# Patient Record
Sex: Female | Born: 1970
Health system: Southern US, Community
[De-identification: ages and names within clinical notes are randomized; demographics above are authoritative.]

## PROBLEM LIST (undated history)

## (undated) DIAGNOSIS — T148XXA Other injury of unspecified body region, initial encounter: Secondary | ICD-10-CM

## (undated) DIAGNOSIS — G43909 Migraine, unspecified, not intractable, without status migrainosus: Secondary | ICD-10-CM

## (undated) DIAGNOSIS — I10 Essential (primary) hypertension: Secondary | ICD-10-CM

## (undated) DIAGNOSIS — E119 Type 2 diabetes mellitus without complications: Secondary | ICD-10-CM

## (undated) DIAGNOSIS — M5030 Other cervical disc degeneration, unspecified cervical region: Secondary | ICD-10-CM

## (undated) DIAGNOSIS — M779 Enthesopathy, unspecified: Secondary | ICD-10-CM

## (undated) HISTORY — PX: TUBAL LIGATION: SHX77

## (undated) HISTORY — DX: Type 2 diabetes mellitus without complications: E11.9

## (undated) HISTORY — PX: MYOMECTOMY: SHX85

## (undated) HISTORY — PX: BUNIONECTOMY: SHX129

---

## 1998-04-17 ENCOUNTER — Emergency Department (HOSPITAL_COMMUNITY): Admission: EM | Admit: 1998-04-17 | Discharge: 1998-04-17 | Payer: Self-pay | Admitting: Emergency Medicine

## 1998-04-17 ENCOUNTER — Encounter: Payer: Self-pay | Admitting: Emergency Medicine

## 1999-05-04 ENCOUNTER — Encounter: Admission: RE | Admit: 1999-05-04 | Discharge: 1999-05-04 | Payer: Self-pay | Admitting: Orthopedic Surgery

## 1999-05-04 ENCOUNTER — Encounter: Payer: Self-pay | Admitting: Orthopedic Surgery

## 1999-09-17 ENCOUNTER — Encounter: Payer: Self-pay | Admitting: Obstetrics and Gynecology

## 1999-09-17 ENCOUNTER — Ambulatory Visit (HOSPITAL_COMMUNITY): Admission: RE | Admit: 1999-09-17 | Discharge: 1999-09-17 | Payer: Self-pay | Admitting: Obstetrics and Gynecology

## 1999-10-15 ENCOUNTER — Other Ambulatory Visit: Admission: RE | Admit: 1999-10-15 | Discharge: 1999-10-15 | Payer: Self-pay | Admitting: Obstetrics and Gynecology

## 2001-01-10 ENCOUNTER — Other Ambulatory Visit: Admission: RE | Admit: 2001-01-10 | Discharge: 2001-01-10 | Payer: Self-pay | Admitting: Obstetrics and Gynecology

## 2001-01-17 ENCOUNTER — Ambulatory Visit (HOSPITAL_COMMUNITY): Admission: RE | Admit: 2001-01-17 | Discharge: 2001-01-17 | Payer: Self-pay | Admitting: Obstetrics and Gynecology

## 2001-01-17 ENCOUNTER — Encounter: Payer: Self-pay | Admitting: Obstetrics and Gynecology

## 2008-07-27 ENCOUNTER — Inpatient Hospital Stay (HOSPITAL_COMMUNITY): Admission: AD | Admit: 2008-07-27 | Discharge: 2008-07-27 | Payer: Self-pay | Admitting: Obstetrics and Gynecology

## 2008-07-27 ENCOUNTER — Encounter (INDEPENDENT_AMBULATORY_CARE_PROVIDER_SITE_OTHER): Payer: Self-pay | Admitting: Obstetrics and Gynecology

## 2009-01-05 ENCOUNTER — Encounter (INDEPENDENT_AMBULATORY_CARE_PROVIDER_SITE_OTHER): Payer: Self-pay | Admitting: Obstetrics and Gynecology

## 2009-01-05 ENCOUNTER — Ambulatory Visit (HOSPITAL_COMMUNITY): Admission: RE | Admit: 2009-01-05 | Discharge: 2009-01-05 | Payer: Self-pay | Admitting: Obstetrics and Gynecology

## 2009-03-21 ENCOUNTER — Inpatient Hospital Stay (HOSPITAL_COMMUNITY): Admission: AD | Admit: 2009-03-21 | Discharge: 2009-03-21 | Payer: Self-pay | Admitting: Obstetrics and Gynecology

## 2009-06-17 ENCOUNTER — Encounter (INDEPENDENT_AMBULATORY_CARE_PROVIDER_SITE_OTHER): Payer: Self-pay | Admitting: Obstetrics and Gynecology

## 2009-06-17 ENCOUNTER — Inpatient Hospital Stay (HOSPITAL_COMMUNITY): Admission: RE | Admit: 2009-06-17 | Discharge: 2009-06-19 | Payer: Self-pay | Admitting: Obstetrics and Gynecology

## 2010-02-04 ENCOUNTER — Encounter
Admission: RE | Admit: 2010-02-04 | Discharge: 2010-03-23 | Payer: Self-pay | Source: Home / Self Care | Attending: Obstetrics and Gynecology | Admitting: Obstetrics and Gynecology

## 2010-02-04 ENCOUNTER — Ambulatory Visit (HOSPITAL_COMMUNITY)
Admission: RE | Admit: 2010-02-04 | Discharge: 2010-02-04 | Payer: Self-pay | Source: Home / Self Care | Attending: Obstetrics and Gynecology | Admitting: Obstetrics and Gynecology

## 2010-02-10 ENCOUNTER — Encounter: Admission: RE | Admit: 2010-02-10 | Payer: Self-pay | Source: Home / Self Care | Admitting: Obstetrics and Gynecology

## 2010-02-21 HISTORY — PX: TUBAL LIGATION: SHX77

## 2010-03-15 ENCOUNTER — Encounter: Payer: Self-pay | Admitting: Obstetrics and Gynecology

## 2010-04-15 ENCOUNTER — Encounter (HOSPITAL_COMMUNITY): Payer: Self-pay | Admitting: Radiology

## 2010-04-15 ENCOUNTER — Inpatient Hospital Stay (HOSPITAL_COMMUNITY)
Admission: AD | Admit: 2010-04-15 | Discharge: 2010-04-16 | DRG: 781 | Disposition: A | Discharge status: Home / Self Care | Payer: Managed Care, Other (non HMO) | Source: Ambulatory Visit | Attending: Obstetrics and Gynecology | Admitting: Obstetrics and Gynecology

## 2010-04-15 ENCOUNTER — Inpatient Hospital Stay (HOSPITAL_COMMUNITY): Payer: Managed Care, Other (non HMO)

## 2010-04-15 DIAGNOSIS — E119 Type 2 diabetes mellitus without complications: Secondary | ICD-10-CM | POA: Diagnosis present

## 2010-04-15 DIAGNOSIS — O2390 Unspecified genitourinary tract infection in pregnancy, unspecified trimester: Secondary | ICD-10-CM | POA: Diagnosis present

## 2010-04-15 DIAGNOSIS — O26879 Cervical shortening, unspecified trimester: Principal | ICD-10-CM | POA: Diagnosis present

## 2010-04-15 DIAGNOSIS — N390 Urinary tract infection, site not specified: Secondary | ICD-10-CM | POA: Diagnosis present

## 2010-04-15 DIAGNOSIS — O24919 Unspecified diabetes mellitus in pregnancy, unspecified trimester: Secondary | ICD-10-CM | POA: Diagnosis present

## 2010-04-15 DIAGNOSIS — O3430 Maternal care for cervical incompetence, unspecified trimester: Secondary | ICD-10-CM

## 2010-04-15 LAB — GLUCOSE, CAPILLARY
Glucose-Capillary: 112 mg/dL — ABNORMAL HIGH (ref 70–99)
Glucose-Capillary: 104 mg/dL — ABNORMAL HIGH (ref 70–99)

## 2010-04-15 LAB — CBC
Hemoglobin: 10.7 g/dL — ABNORMAL LOW (ref 12.0–15.0)
MCH: 27.5 pg (ref 26.0–34.0)
MCHC: 32.1 g/dL (ref 30.0–36.0)
RBC: 3.89 MIL/uL (ref 3.87–5.11)
RDW: 14.5 % (ref 11.5–15.5)

## 2010-04-15 LAB — DIFFERENTIAL
Basophils Absolute: 0 10*3/uL (ref 0.0–0.1)
Basophils Relative: 0 % (ref 0–1)

## 2010-04-16 LAB — GLUCOSE, CAPILLARY
Glucose-Capillary: 108 mg/dL — ABNORMAL HIGH (ref 70–99)
Glucose-Capillary: 102 mg/dL — ABNORMAL HIGH (ref 70–99)

## 2010-04-17 LAB — STREP B DNA PROBE: Strep Group B Ag: NEGATIVE

## 2010-04-17 LAB — URINE CULTURE: Colony Count: >=100000

## 2010-05-09 LAB — CBC
HCT: 33.2 % — ABNORMAL LOW (ref 36.0–46.0)
Hemoglobin: 10.6 g/dL — ABNORMAL LOW (ref 12.0–15.0)

## 2010-05-09 LAB — HCG, SERUM, QUALITATIVE: Preg, Serum: NEGATIVE

## 2010-05-11 LAB — CBC
HCT: 32.8 % — ABNORMAL LOW (ref 36.0–46.0)
Hemoglobin: 10.3 g/dL — ABNORMAL LOW (ref 12.0–15.0)
Hemoglobin: 10.6 g/dL — ABNORMAL LOW (ref 12.0–15.0)
MCHC: 32.3 g/dL (ref 30.0–36.0)
MCV: 80 fL (ref 78.0–100.0)
Platelets: 236 10*3/uL (ref 150–400)
Platelets: 255 10*3/uL (ref 150–400)
RDW: 15.4 % (ref 11.5–15.5)
RDW: 15.6 % — ABNORMAL HIGH (ref 11.5–15.5)

## 2010-05-11 LAB — PREGNANCY, URINE: Preg Test, Ur: NEGATIVE

## 2010-05-26 LAB — CBC
HCT: 31 % — ABNORMAL LOW (ref 36.0–46.0)
Hemoglobin: 9.9 g/dL — ABNORMAL LOW (ref 12.0–15.0)
Platelets: 244 10*3/uL (ref 150–400)
RBC: 4.08 MIL/uL (ref 3.87–5.11)
WBC: 4.9 10*3/uL (ref 4.0–10.5)

## 2010-05-31 LAB — CBC
HCT: 36.5 % (ref 36.0–46.0)
Hemoglobin: 12.5 g/dL (ref 12.0–15.0)
MCV: 87.3 fL (ref 78.0–100.0)
Platelets: 197 10*3/uL (ref 150–400)
RBC: 4.18 MIL/uL (ref 3.87–5.11)
RDW: 13.8 % (ref 11.5–15.5)

## 2010-07-09 ENCOUNTER — Inpatient Hospital Stay (HOSPITAL_COMMUNITY): Payer: Managed Care, Other (non HMO)

## 2010-07-09 ENCOUNTER — Inpatient Hospital Stay (HOSPITAL_COMMUNITY)
Admission: AD | Admit: 2010-07-09 | Discharge: 2010-07-09 | Disposition: A | Discharge status: Home / Self Care | Payer: Managed Care, Other (non HMO) | Source: Ambulatory Visit | Attending: Obstetrics & Gynecology | Admitting: Obstetrics & Gynecology

## 2010-07-09 DIAGNOSIS — O368390 Maternal care for abnormalities of the fetal heart rate or rhythm, unspecified trimester, not applicable or unspecified: Secondary | ICD-10-CM | POA: Insufficient documentation

## 2010-07-09 DIAGNOSIS — O99810 Abnormal glucose complicating pregnancy: Secondary | ICD-10-CM | POA: Insufficient documentation

## 2010-07-26 ENCOUNTER — Other Ambulatory Visit: Payer: Self-pay | Admitting: Obstetrics and Gynecology

## 2010-07-26 ENCOUNTER — Encounter (HOSPITAL_COMMUNITY): Payer: Managed Care, Other (non HMO)

## 2010-07-26 LAB — BASIC METABOLIC PANEL
BUN: 7 mg/dL (ref 6–23)
Creatinine, Ser: 0.58 mg/dL (ref 0.4–1.2)
GFR calc non Af Amer: >60 mL/min (ref >60)
Glucose, Bld: 121 mg/dL — ABNORMAL HIGH (ref 70–99)
Potassium: 3.6 mEq/L (ref 3.5–5.1)

## 2010-07-26 LAB — CBC
HCT: 34.7 % — ABNORMAL LOW (ref 36.0–46.0)
MCH: 28.1 pg (ref 26.0–34.0)
MCHC: 32.9 g/dL (ref 30.0–36.0)
MCV: 85.7 fL (ref 78.0–100.0)
RDW: 14.3 % (ref 11.5–15.5)
WBC: 6.4 10*3/uL (ref 4.0–10.5)

## 2010-08-02 ENCOUNTER — Other Ambulatory Visit: Payer: Self-pay | Admitting: Obstetrics and Gynecology

## 2010-08-02 ENCOUNTER — Inpatient Hospital Stay (HOSPITAL_COMMUNITY)
Admission: RE | Admit: 2010-08-02 | Discharge: 2010-08-05 | DRG: 765 | Disposition: A | Discharge status: Home / Self Care | Payer: Managed Care, Other (non HMO) | Source: Ambulatory Visit | Attending: Obstetrics and Gynecology | Admitting: Obstetrics and Gynecology

## 2010-08-02 DIAGNOSIS — Z01818 Encounter for other preprocedural examination: Secondary | ICD-10-CM

## 2010-08-02 DIAGNOSIS — Z302 Encounter for sterilization: Secondary | ICD-10-CM

## 2010-08-02 DIAGNOSIS — O9903 Anemia complicating the puerperium: Secondary | ICD-10-CM | POA: Diagnosis not present

## 2010-08-02 DIAGNOSIS — O2432 Unspecified pre-existing diabetes mellitus in childbirth: Secondary | ICD-10-CM | POA: Diagnosis present

## 2010-08-02 DIAGNOSIS — Z01812 Encounter for preprocedural laboratory examination: Secondary | ICD-10-CM

## 2010-08-02 DIAGNOSIS — D62 Acute posthemorrhagic anemia: Secondary | ICD-10-CM | POA: Diagnosis not present

## 2010-08-02 DIAGNOSIS — O3490 Maternal care for abnormality of pelvic organ, unspecified, unspecified trimester: Principal | ICD-10-CM | POA: Diagnosis present

## 2010-08-02 DIAGNOSIS — E119 Type 2 diabetes mellitus without complications: Secondary | ICD-10-CM | POA: Diagnosis present

## 2010-08-02 DIAGNOSIS — O09529 Supervision of elderly multigravida, unspecified trimester: Secondary | ICD-10-CM | POA: Diagnosis present

## 2010-08-02 DIAGNOSIS — D4959 Neoplasm of unspecified behavior of other genitourinary organ: Secondary | ICD-10-CM | POA: Diagnosis present

## 2010-08-02 DIAGNOSIS — O34599 Maternal care for other abnormalities of gravid uterus, unspecified trimester: Secondary | ICD-10-CM | POA: Diagnosis present

## 2010-08-02 DIAGNOSIS — D252 Subserosal leiomyoma of uterus: Secondary | ICD-10-CM | POA: Diagnosis present

## 2010-08-02 LAB — GLUCOSE, CAPILLARY
Glucose-Capillary: 99 mg/dL (ref 70–99)
Glucose-Capillary: 101 mg/dL — ABNORMAL HIGH (ref 70–99)
Glucose-Capillary: 121 mg/dL — ABNORMAL HIGH (ref 70–99)

## 2010-08-03 LAB — CBC
MCH: 27.4 pg (ref 26.0–34.0)
MCHC: 32.1 g/dL (ref 30.0–36.0)
Platelets: 135 10*3/uL — ABNORMAL LOW (ref 150–400)
RBC: 3.5 MIL/uL — ABNORMAL LOW (ref 3.87–5.11)

## 2010-08-03 LAB — GLUCOSE, CAPILLARY
Glucose-Capillary: 172 mg/dL — ABNORMAL HIGH (ref 70–99)
Glucose-Capillary: 102 mg/dL — ABNORMAL HIGH (ref 70–99)

## 2010-08-04 LAB — GLUCOSE, CAPILLARY
Glucose-Capillary: 116 mg/dL — ABNORMAL HIGH (ref 70–99)
Glucose-Capillary: 99 mg/dL (ref 70–99)
Glucose-Capillary: 128 mg/dL — ABNORMAL HIGH (ref 70–99)
Glucose-Capillary: 106 mg/dL — ABNORMAL HIGH (ref 70–99)

## 2010-08-05 LAB — GLUCOSE, CAPILLARY: Glucose-Capillary: 102 mg/dL — ABNORMAL HIGH (ref 70–99)

## 2010-08-10 NOTE — Op Note (Signed)
NAMEJAKIYAH, STEPNEY NO.:  [id number]  MEDICAL RECORD NO.:  [id number]  LOCATION:  9148                          FACILITY:  [facility]  PHYSICIAN:  Maxie Better, M.D.DATE OF BIRTH:  1970-05-04  DATE OF PROCEDURE:  08/02/2010 DATE OF DISCHARGE:                              OPERATIVE REPORT   PREOPERATIVE DIAGNOSES:  Previous myomectomy, term gestation class B diabetic, desires sterilization.  POSTOPERATIVE DIAGNOSES:  Previous myomectomy, desires sterilization, term gestation class B diabetes.  PROCEDURES:  Primary cesarean section, Sharl Ma hysterotomy, modified Pomeroy tubal ligation.  ANESTHESIA:  Spinal.  SURGEON:  Maxie Better, MD  ASSISTANT:  Marlinda Mike, CNM  PROCEDURE IN DETAILS:  Under adequate spinal anesthesia, the patient was placed in a supine position with a left lateral tilt.  She was sterilely prepped and draped in usual fashion.  Indwelling Foley catheter was sterilely placed.  Marcaine 0.25% was injected along the previous Pfannenstiel skin incision site.  Pfannenstiel skin incision was then made, carried down to the rectus fascia.  The rectus fascia was then opened transversely, the rectus fascia was then sharply and bluntly dissected off the rectus muscle in a superior and inferior fashion.  The rectus muscle was split in the midline.  The parietal peritoneum was entered bluntly.  The lower uterine segment was undeveloped. Vesicouterine peritoneum was opened transversely.  There was large prominent blood vessels noted on both sides of the incision and low transverse uterine incision was then made and extended with bandage scissors.  Artificial rupture of membranes was performed after securing the floating vertex.  Subsequent delivery of a live female from the right occiput transverse position was accomplished.  Cord around the neck x2 was reduced.  The baby was bulb suctioned.  The abdomen and cord was clamped, cut, and the baby  was transferred to the awaiting pediatrician who assigned Apgars of 9 and 9 at 1 and 5 minutes.  The placenta was manually removed with a partially focal finding of the placenta attached in the left upper quadrant.  The placenta was removed, the pieces in that left upper quadrant was removed.  The cavity was explored.  No other findings were noted.  The incision on the right had pumping vessels in 2 locations.  The uterine incision was then closed in 2 layers.  The first layer was 0 Monocryl running locked stitch.  Second layer was imbricated using 0 Monocryl suture.  Figure-of-eight sutures placed along the incision line for hemostasis.  Small bleeding was cauterized in the inferior aspect of the incision.  Attention was then turned to the fallopian tubes, both of which were normal.  The ovaries were normal. There was omental adhesion to the left fundal region which was lysed. The midportion of both fallopian tube was grasped with a Babcock.  The underlying mesosalpinx was opened with cautery.  The proximal distal portion of the tube was then tied with 0 chromic suture proximally and distally x2 on both sides and intervening segment of fallopian tubes was removed on both sides.  Small subserosal fibroids were noted which were not needed to be removed.  The abdomen was then copiously irrigated and suctioned.  The uterine incision was inspected.  Small bleeders again cauterized with good hemostasis subsequently noted.  The parietal peritoneum was closed with 0 Vicryl, the rectus fascia was closed with 0 Vicryl x2.  The subcutaneous area was irrigated, small bleeders cauterized.  Interrupted 2-0 plain sutures placed.  The previous scar was partially removed and the skin approximated using Ethicon staples.  SPECIMENS:  Portion of right and left fallopian tubes sent to Pathology. The placenta was not sent.  Estimated blood loss was 1 L.  Urine output was 250 mL clear  yellow urine.  INTRAOPERATIVE FLUIDS:  4300.  Sponge and instrument counts x2 was correct.  COMPLICATIONS:  None.  Weight of the baby was 8 pounds 5 ounces.  The patient tolerated the procedure well, was transferred to recovery in stable condition.     Maxie Better, M.D.     Advance/MEDQ  D:  08/02/2010  T:  08/03/2010  Job:  [id number]  Electronically Signed by Nena Jordan Lea Baine M.D. on 08/10/2010 05:53:27 AM

## 2010-09-11 NOTE — Discharge Summary (Signed)
Becky Simmons, Becky Simmons             ACCOUNT NO.:  [id number]  MEDICAL RECORD NO.:  [id number]  LOCATION:  9148                          [facility]ACILITY:  [facility]  PHYSICIAN:  Maxie Better, M.D.DATE OF BIRTH:  1970/08/25  DATE OF ADMISSION:  08/02/2010 DATE OF DISCHARGE:  08/05/2010                              DISCHARGE SUMMARY   ADMITTING DIAGNOSES: 1. A 39 weeks' gestation. 2. Previous myomectomy. 3. Class B diabetic, insulin dependent. 4. Scheduled primary cesarean section.  DISCHARGE DIAGNOSES: 1. Postoperative day #3 status post cesarean section and bilateral     tubal sterilization. 2. Class B diabetic.  PATIENT HISTORY:  The patient is a 40 year old gravida 5, 3-0-1-3 at 32 weeks' gestation with an [facility] of August 07, 2010.  The patient has received prenatal care at [facility] since 8 weeks' gestation with a primary as Dr. Cherly Hensen.  PRENATAL LABS:  Blood type and Rh AB positive, antibody screen negative, rubella positive, HIV negative, RPR negative, hepatitis B negative, and a GBS negative.  Early GTT screening in first trimester revealed a blood sugar of 230 with diagnosis of preexisting diabetes.  Prenatal course was complicated by preexisting diabetes and diagnosed prior to pregnancy, obesity, advanced maternal age, previous myomectomy.  MEDICAL SURGICAL HISTORY:  History of migraines and myomectomy in 2011.  ALLERGIES:  No known drug allergies.  CURRENT MEDICATIONS AT TIME OF ADMISSION: 1. Prenatal vitamin daily. 2. Lantus 38 units at bedtime. 3. Humalog 8 units subcu with meals.  PRESENTATION:  The patient presents for scheduled cesarean section. Preoperative CBC; white blood cell count of 6.4, hemoglobin of 11.4, hematocrit 34.7, and a platelet count of 149.  PROCEDURE:  The patient undergoes cesarean section and bilateral tubal sterilization by Dr. Cherly Hensen on the day of admission, with delivery of a female, 8 pounds 5 ounces, with Apgar scores of 9 and 9.   Newborn is transferred to the regular nursery.  POSTOPERATIVE CARE:  The patient's postoperative course was uncomplicated with achievement of glycemic control postpartum postop prior to discharge.  Postoperative CBC; white blood cell count 9.5, hemoglobin 9.6, hematocrit 29.9, and a platelet count of 135.  Vital signs were stable.  The patient remained afebrile during hospitalization.  The patient was initiated on half of her insulin dose for antepartum dosing.  Fasting blood sugars 166, 135, 126; and postprandial range of 97-128 on 20 units of Lantus at bedtime and NovoLog 4 units with meals.  Physical exam is within normal limits. Wound edges are well approximated with staples.  No erythema, no ecchymosis, or drainage.  The patient is breast and bottle feeding. Newborn undergoes circumcision prior to discharge.  DISCHARGE CARE:  The patient was discharged home in stable condition on postoperative day #3.  Staples were intact for removal on day 7 at [facility]. The patient is on carb-modified diet.  Activity restrictions x2 weeks.  POSTPARTUM INSTRUCTIONS:  Per [facility] booklet.  MEDICATIONS AT THE TIME OF DISCHARGE: 1. Prenatal vitamin 1 tablet daily. 2. Nu-Iron 150 mg p.o. daily x8 weeks. 3. Ibuprofen 800 mg every 8 hours as needed for discomfort. 4. Percocet 1-2 tablets every 4 hours as needed for pain. 5. Lantus 20 units at bedtime. 6. Metformin 500  mg p.o. in a.m.  The patient to continue monitoring blood sugars with a fasting, 2-hour postprandial lunch, and 2-hour postprandial dinner for monitoring t.i.d. The patient to return to [facility] on Monday, August 09, 2010, for staple removal and review of blood sugar control with the goal for discontinuation of Lantus and saturation of metformin dose for oral glycemic control.     Becky Simmons, C.N.M.   ______________________________ Maxie Better, M.D.    TB/MEDQ  D:  08/06/2010  T:  08/06/2010  Job:  [id number]  Electronically  Signed by Becky Simmons C.N.M. on 09/08/2010 07:01:40 PM Electronically Signed by Nena Jordan Danea Manter M.D. on 09/11/2010 06:34:40 AM

## 2010-12-07 ENCOUNTER — Encounter (HOSPITAL_COMMUNITY): Payer: Self-pay | Admitting: *Deleted

## 2011-11-18 ENCOUNTER — Other Ambulatory Visit (HOSPITAL_BASED_OUTPATIENT_CLINIC_OR_DEPARTMENT_OTHER): Payer: Self-pay | Admitting: Family Medicine

## 2011-11-18 ENCOUNTER — Ambulatory Visit (HOSPITAL_BASED_OUTPATIENT_CLINIC_OR_DEPARTMENT_OTHER)
Admission: RE | Admit: 2011-11-18 | Discharge: 2011-11-18 | Disposition: A | Discharge status: Home / Self Care | Payer: BC Managed Care – PPO | Source: Ambulatory Visit | Attending: Family Medicine | Admitting: Family Medicine

## 2011-11-18 DIAGNOSIS — R1031 Right lower quadrant pain: Secondary | ICD-10-CM

## 2012-01-20 ENCOUNTER — Encounter (HOSPITAL_BASED_OUTPATIENT_CLINIC_OR_DEPARTMENT_OTHER): Payer: Self-pay | Admitting: *Deleted

## 2012-01-20 ENCOUNTER — Emergency Department (HOSPITAL_BASED_OUTPATIENT_CLINIC_OR_DEPARTMENT_OTHER)
Admission: EM | Admit: 2012-01-20 | Discharge: 2012-01-20 | Disposition: A | Discharge status: Home / Self Care | Payer: BC Managed Care – PPO | Attending: Emergency Medicine | Admitting: Emergency Medicine

## 2012-01-20 DIAGNOSIS — R1084 Generalized abdominal pain: Secondary | ICD-10-CM | POA: Insufficient documentation

## 2012-01-20 DIAGNOSIS — Z3202 Encounter for pregnancy test, result negative: Secondary | ICD-10-CM | POA: Insufficient documentation

## 2012-01-20 DIAGNOSIS — R109 Unspecified abdominal pain: Secondary | ICD-10-CM

## 2012-01-20 LAB — CBC
Hemoglobin: 11 g/dL — ABNORMAL LOW (ref 12.0–15.0)
Platelets: 215 10*3/uL (ref 150–400)
RBC: 4.15 MIL/uL (ref 3.87–5.11)
WBC: 5.3 10*3/uL (ref 4.0–10.5)

## 2012-01-20 LAB — COMPREHENSIVE METABOLIC PANEL
ALT: 9 U/L (ref 0–35)
AST: 11 U/L (ref 0–37)
Alkaline Phosphatase: 106 U/L (ref 39–117)
CO2: 24 mEq/L (ref 19–32)
Calcium: 8.8 mg/dL (ref 8.4–10.5)
Chloride: 100 mEq/L (ref 96–112)
GFR calc Af Amer: >90 mL/min (ref >90)
GFR calc non Af Amer: >90 mL/min (ref >90)
Glucose, Bld: 213 mg/dL — ABNORMAL HIGH (ref 70–99)
Potassium: 3.7 mEq/L (ref 3.5–5.1)
Sodium: 136 mEq/L (ref 135–145)

## 2012-01-20 LAB — URINALYSIS, ROUTINE W REFLEX MICROSCOPIC
Glucose, UA: NEGATIVE mg/dL
Ketones, ur: NEGATIVE mg/dL
Nitrite: NEGATIVE
Specific Gravity, Urine: 1.023 (ref 1.005–1.030)
pH: 6 (ref 5.0–8.0)

## 2012-01-20 LAB — PREGNANCY, URINE: Preg Test, Ur: NEGATIVE

## 2012-01-20 LAB — URINE MICROSCOPIC-ADD ON

## 2012-01-20 NOTE — ED Notes (Signed)
Pt describes soreness and pressure in her abdomen.  This sensation began after she felt a "pop" in her abdomen (unable to say where).  Pt states that she feels that her abdomen is protruding more since this began.  Pt states that she had increasing soreness (it was mild pain) yesterday and she was unable to eat as much as normal.  No n/v diarrhea or chills with this.  Pt is in no acute distress at this time.  No GU symptoms, no mass or hernia reported or felt on pt abdomen

## 2012-01-20 NOTE — ED Provider Notes (Signed)
History     CSN: [id number]  Arrival date & time 01/20/12  3244   First MD Initiated Contact with Patient 01/20/12 0945      Chief Complaint  Patient presents with  . Abdominal Pain    (Consider location/radiation/quality/duration/timing/severity/associated sxs/prior treatment) HPI Pt presenting with c/o sensation of pressure and soreness in her abdomen diffusely.  She states that 2 weeks ago she felt a pop in her mid abdomen and feels that her abdomen is more distended than usual.  She states she feels muscle soreness of abdominal wall- similar to doing situps. No vomiting, no fever/chills, no dsyuria.  No change in stools.  States she gets full with eating approx 1/2 of what she normally would eat over the past 2 weeks.  Cannot say what makes pain better or worse.  Denies pain currently.  There are no other associated systemic symptoms, there are no other alleviating or modifying factors.   History reviewed. No pertinent past medical history.  Past Surgical History  Procedure Date  . Cesarean section 2012  . Myomectomy   . Tubal ligation     No family history on file.  History  Substance Use Topics  . Smoking status: Not on file  . Smokeless tobacco: Not on file  . Alcohol Use: No    OB History    Grav Para Term Preterm Abortions TAB SAB Ect Mult Living   1               Review of Systems ROS reviewed and all otherwise negative except for mentioned in HPI  Allergies  Review of patient's allergies indicates no known allergies.  Home Medications  No current outpatient prescriptions on file.  BP 135/90  Pulse 79  Temp 98.8 F (37.1 C) (Oral)  Ht 5\' 10"  (1.778 m)  Wt 230 lb (104.327 kg)  BMI 33.00 kg/m2  SpO2 99%  LMP 12/23/2011 Vitals reviewed Physical Exam Physical Examination: General appearance - alert, well appearing, and in no distress Mental status - alert, oriented to person, place, and time Eyes - no conjunctival injection, no scleral  icterus Mouth - mucous membranes moist, pharynx normal without lesions Chest - clear to auscultation, no wheezes, rales or rhonchi, symmetric air entry Heart - normal rate, regular rhythm, normal S1, S2, no murmurs, rubs, clicks or gallops Abdomen - soft, nontender, nondistended, no masses or organomegaly, nabs Extremities - peripheral pulses normal, no pedal edema, no clubbing or cyanosis Skin - normal coloration and turgor, no rashes  ED Course  Procedures (including critical care time)  Labs Reviewed  URINALYSIS, ROUTINE W REFLEX MICROSCOPIC - Abnormal; Notable for the following:    Leukocytes, UA SMALL (*)     All other components within normal limits  CBC - Abnormal; Notable for the following:    Hemoglobin 11.0 (*)     HCT 33.0 (*)     All other components within normal limits  COMPREHENSIVE METABOLIC PANEL - Abnormal; Notable for the following:    Glucose, Bld 213 (*)     All other components within normal limits  URINE MICROSCOPIC-ADD ON - Abnormal; Notable for the following:    Bacteria, UA MANY (*)     Casts HYALINE CASTS (*)     All other components within normal limits  PREGNANCY, URINE  LIPASE, BLOOD  URINE CULTURE   No results found.   1. Abdominal pain       MDM  Pt presenting with diffuse abdominal soreness and decreased appetite.  She has no tenderness of her abdomen on exam, overall benign exam.  Labs reassuring as well.  Doubt acute emergent condition such as SBO, cholecystitis or other intra-abdominal pathology.  Pt advised to f/u with her PMD for further testing if symptoms continue.  Discharged with strict return precautions.  Pt agreeable with plan.        Ethelda Chick, MD 01/20/12 1153

## 2012-01-21 LAB — URINE CULTURE

## 2012-01-22 NOTE — ED Notes (Signed)
+   Urine Chart sent to EDP office for review. 

## 2012-01-24 NOTE — ED Notes (Signed)
Chart returned from EDP. Likely contaminant. No abx tx necessary at this time Per Trixie Dredge.

## 2012-03-28 ENCOUNTER — Other Ambulatory Visit: Payer: Self-pay | Admitting: Family Medicine

## 2012-03-28 DIAGNOSIS — Z1231 Encounter for screening mammogram for malignant neoplasm of breast: Secondary | ICD-10-CM

## 2012-04-20 ENCOUNTER — Ambulatory Visit
Admission: RE | Admit: 2012-04-20 | Discharge: 2012-04-20 | Disposition: A | Discharge status: Home / Self Care | Payer: BC Managed Care – PPO | Source: Ambulatory Visit | Attending: Family Medicine | Admitting: Family Medicine

## 2012-04-20 DIAGNOSIS — Z1231 Encounter for screening mammogram for malignant neoplasm of breast: Secondary | ICD-10-CM

## 2012-06-15 ENCOUNTER — Telehealth: Payer: Self-pay | Admitting: *Deleted

## 2012-06-15 MED ORDER — SITAGLIP PHOS-METFORMIN HCL ER 50-1000 MG PO TB24: 1.0000 | ORAL_TABLET | Freq: Two times a day (BID) | ORAL | Status: DC

## 2012-06-15 NOTE — Telephone Encounter (Signed)
PT SAID DR. ZANARD PRESCRIBED JANUMET AND SHE SAID ITS NOT WORKING SHE NEEDS THE JANUMET XR. PT SAID DR. Herma Carson WANTED HER TO TAKE THE ONE WITH THE XR BUT WROTE IF FOR JUST JANUMET. PHARMACY USED IS [facility] ON [address].

## 2012-07-02 ENCOUNTER — Telehealth: Payer: Self-pay | Admitting: *Deleted

## 2012-07-02 NOTE — Telephone Encounter (Signed)
PT CALLED AND LM ON VOICE MAIL - SAYING THAT SHE IS TAKING JANUMET AND IS MAKING NAUSEATED EVERYDAY- WANTED TO KNOW IF DR. ZANARD COULD GIVE HER RX FOR JANUMET XR BECAUSE IT DOES NOT MAKE HER SICK. SHE SAID THAT SHE HAD CALLED BEFORE IN REF. TO THIS. PLEASE ADVISE

## 2012-07-03 NOTE — Telephone Encounter (Signed)
I did this on 4/25.  It was sent to [facility] on [address]. Please let her know she needs to contact her pharmacy. Thanks PG

## 2012-07-13 ENCOUNTER — Other Ambulatory Visit: Payer: Self-pay | Admitting: Family Medicine

## 2012-07-13 LAB — COMPLETE METABOLIC PANEL WITH GFR
ALT: 14 U/L (ref 0–35)
AST: 24 U/L (ref 0–37)
Albumin: 3.6 g/dL (ref 3.5–5.2)
Alkaline Phosphatase: 55 U/L (ref 39–117)
BUN: 8 mg/dL (ref 6–23)
CO2: 25 mEq/L (ref 19–32)
Calcium: 8.6 mg/dL (ref 8.4–10.5)
Chloride: 105 mEq/L (ref 96–112)
Creat: 0.76 mg/dL (ref 0.50–1.10)
GFR, Est African American: >89 mL/min
GFR, Est Non African American: >89 mL/min
Glucose, Bld: 92 mg/dL (ref 70–99)
Potassium: 4.1 mEq/L (ref 3.5–5.3)
Sodium: 138 mEq/L (ref 135–145)
Total Bilirubin: 0.7 mg/dL (ref 0.3–1.2)
Total Protein: 6.1 g/dL (ref 6.0–8.3)

## 2012-07-13 LAB — HEMOGLOBIN A1C
Hgb A1c MFr Bld: 6.2 % — ABNORMAL HIGH (ref <5.7)
Mean Plasma Glucose: 131 mg/dL — ABNORMAL HIGH (ref <117)

## 2012-07-14 LAB — MICROALBUMIN, URINE: Microalb, Ur: 0.68 mg/dL (ref 0.00–1.89)

## 2012-07-20 ENCOUNTER — Encounter: Payer: Self-pay | Admitting: Family Medicine

## 2012-07-20 ENCOUNTER — Encounter: Payer: Self-pay | Admitting: *Deleted

## 2012-07-20 ENCOUNTER — Ambulatory Visit (INDEPENDENT_AMBULATORY_CARE_PROVIDER_SITE_OTHER): Payer: BC Managed Care – PPO | Admitting: Family Medicine

## 2012-07-20 VITALS — BP 129/82 | HR 77 | Ht 70.0 in | Wt 216.0 lb

## 2012-07-20 DIAGNOSIS — IMO0001 Reserved for inherently not codable concepts without codable children: Secondary | ICD-10-CM

## 2012-07-20 DIAGNOSIS — Z23 Encounter for immunization: Secondary | ICD-10-CM

## 2012-07-20 MED ORDER — CANAGLIFLOZIN 300 MG PO TABS: 1.0000 | ORAL_TABLET | Freq: Every day | ORAL | Status: DC

## 2012-07-20 MED ORDER — SITAGLIP PHOS-METFORMIN HCL ER 50-1000 MG PO TB24: 1.0000 | ORAL_TABLET | Freq: Two times a day (BID) | ORAL | Status: DC

## 2012-07-20 NOTE — Patient Instructions (Addendum)
1)  Type II DM:  Continue on the Janumet XR and we'll change the [place] to Invokana 300 mg.  If you start to get lows then decrease your Janumet to 1 x per day.    Diabetes and Standards of Medical Care  Diabetes is complicated. You may find that your diabetes team includes a dietitian, nurse, diabetes educator, eye doctor, and more. To help everyone know what is going on and to help you get the care you deserve, the following schedule of care was developed to help keep you on track. Below are the tests, exams, vaccines, medicines, education, and plans you will need. A1c test  Performed at least 2 times a year if you are meeting treatment goals.  Performed 4 times a year if therapy has changed or if you are not meeting treatment goals. Blood pressure test  Performed at every routine medical visit. The goal is less than 120/80 mmHg. Dental exam  Follow up with the dentist regularly. Eye exam  Diagnosed with type 1 diabetes as a child: Get an exam upon reaching the age of 10 years or older and having had diabetes for 3 5 years. Yearly eye exams are recommended after that initial eye exam.  Diagnosed with type 1 diabetes as an adult: Get an exam within 5 years of diagnosis and then yearly.  Diagnosed with type 2 diabetes: Get an exam as soon as possible after the diagnosis and then yearly. Foot care exam  Visual foot exams are performed at every routine medical visit. The exams check for cuts, injuries, or other problems with the feet.  A comprehensive foot exam should be done yearly. This includes visual inspection as well as assessing foot pulses and testing for loss of sensation. Kidney function test (urine microalbumin)  Performed once a year.  Type 1 diabetes: The first test is performed 5 years after diagnosis.  Type 2 diabetes: The first test is performed at the time of diagnosis.  A serum creatinine and estimated glomerular filtration rate (eGFR) test is done once a year to  tell the level of chronic kidney disease (CKD), if present. Lipid profile (Cholesterol, HDL, LDL, Triglycerides)  Performed every 5 years for most people.  The goal for LDL is less than 100 mg/dl. If at high risk, the goal is less than 70 mg/dl.  The goal for HDL is 40 mg/dl 50 mg/dl for men and 50 mg/dl 60 mg/dl for women. An HDL cholesterol of 60 mg/dL or higher gives some protection against heart disease.  The goal for triglycerides is less than 150 mg/dl. Influenza vaccine, pneumococcal vaccine, and hepatitis B vaccine  The influenza vaccine is recommended yearly.  The pneumococcal vaccine is generally given once in a lifetime. However, there are some instances when another vaccination is recommended. Check with your caregiver.  The hepatitis B vaccine is also recommended for adults with diabetes. Diabetes self-management education  Recommended at diagnosis and ongoing as needed. Treatment plan  Reviewed at every medical visit. Document Released: 12/05/2008 Document Revised: 01/25/2012 Document Reviewed: 08/10/2010 [facility] Patient Information 2014 [place], [place].

## 2012-07-20 NOTE — Progress Notes (Signed)
Subjective:     Patient ID: Becky Simmons, female   DOB: 03-05-1970, 42 y.o.   MRN: [id number]  HPI  Becky Simmons is here today today to discuss her lab results and to get her Type II DM medications refilled.   Review of Systems  Constitutional: Negative for activity change, fatigue and unexpected weight change.  HENT: Negative.   Eyes: Negative.   Respiratory: Negative for shortness of breath.   Cardiovascular: Negative for chest pain, palpitations and leg swelling.  Gastrointestinal: Negative for diarrhea and constipation.  Endocrine: Negative.   Genitourinary: Negative for difficulty urinating.  Musculoskeletal: Negative.   Skin: Negative.   Neurological: Negative.   Hematological: Negative for adenopathy. Does not bruise/bleed easily.  Psychiatric/Behavioral: Negative for sleep disturbance and dysphoric mood. The patient is not nervous/anxious.      Past Medical History  Diagnosis Date  . Gestational diabetes    Family History  Problem Relation Age of Onset  . Diabetes Mother   . Diabetes Maternal Grandmother   . Diabetes Paternal Grandmother    History   Social History Narrative   Marital Status:  Married Clifton Custard)    Children:  5   Pets: Dog (1)   Living Situation: Lives with spouse and children.    Occupation: Futures trader    Education: Careers adviser (Cosmetology);    Tobacco Use/Exposure:  None    Alcohol Use:  Occasional   Drug Use:  None   Diet:  Regular   Exercise:  None   Hobbies: Movies                Objective:   Physical Exam  Constitutional: She appears well-nourished. No distress.  HENT:  Head: Normocephalic.  Eyes: No scleral icterus.  Neck: No thyromegaly present.  Cardiovascular: Normal rate, regular rhythm and normal heart sounds.   Pulmonary/Chest: Effort normal and breath sounds normal.  Abdominal: There is no tenderness.  Musculoskeletal: She exhibits no edema and no tenderness.  Neurological: She is alert.  Skin: Skin is warm  and dry.  Psychiatric: She has a normal mood and affect. Her behavior is normal. Judgment and thought content normal.       Assessment:        Plan:

## 2012-07-27 DIAGNOSIS — IMO0001 Reserved for inherently not codable concepts without codable children: Secondary | ICD-10-CM | POA: Insufficient documentation

## 2012-07-27 DIAGNOSIS — Z23 Encounter for immunization: Secondary | ICD-10-CM | POA: Insufficient documentation

## 2012-07-27 NOTE — Assessment & Plan Note (Signed)
She was given a Pneumovax without difficulty.

## 2012-07-27 NOTE — Assessment & Plan Note (Signed)
Her A1c is much improved on the [place]. She is going to try a combination of Invokana and Janumet XR.

## 2012-09-03 ENCOUNTER — Ambulatory Visit: Payer: BC Managed Care – PPO

## 2012-09-04 ENCOUNTER — Ambulatory Visit (INDEPENDENT_AMBULATORY_CARE_PROVIDER_SITE_OTHER): Payer: BC Managed Care – PPO | Admitting: *Deleted

## 2012-09-04 DIAGNOSIS — Z111 Encounter for screening for respiratory tuberculosis: Secondary | ICD-10-CM

## 2012-09-04 MED ORDER — TUBERCULIN PPD 5 UNIT/0.1ML ID SOLN
5.0000 [IU] | Freq: Once | INTRADERMAL | Status: AC
  Administered 2012-09-04: 5 [IU] via INTRADERMAL

## 2012-09-05 ENCOUNTER — Ambulatory Visit: Payer: BC Managed Care – PPO

## 2012-09-07 ENCOUNTER — Ambulatory Visit (INDEPENDENT_AMBULATORY_CARE_PROVIDER_SITE_OTHER): Payer: BC Managed Care – PPO | Admitting: *Deleted

## 2012-09-07 DIAGNOSIS — Z111 Encounter for screening for respiratory tuberculosis: Secondary | ICD-10-CM

## 2012-09-07 LAB — READ PPD: TB Skin Test: NEGATIVE

## 2012-09-07 MED ORDER — TUBERCULIN PPD 5 UNIT/0.1ML ID SOLN
5.0000 [IU] | Freq: Once | INTRADERMAL | Status: AC
  Administered 2012-09-07: 5 [IU] via INTRADERMAL

## 2012-09-10 ENCOUNTER — Ambulatory Visit: Payer: BC Managed Care – PPO | Admitting: *Deleted

## 2012-09-10 ENCOUNTER — Encounter: Payer: Self-pay | Admitting: *Deleted

## 2012-09-10 DIAGNOSIS — Z111 Encounter for screening for respiratory tuberculosis: Secondary | ICD-10-CM

## 2012-09-10 LAB — READ PPD: TB Skin Test: NEGATIVE

## 2012-09-27 ENCOUNTER — Other Ambulatory Visit: Payer: Self-pay | Admitting: *Deleted

## 2012-09-27 MED ORDER — CANAGLIFLOZIN 300 MG PO TABS: 1.0000 | ORAL_TABLET | Freq: Every day | ORAL | Status: DC

## 2013-03-05 ENCOUNTER — Ambulatory Visit (INDEPENDENT_AMBULATORY_CARE_PROVIDER_SITE_OTHER): Payer: BC Managed Care – PPO | Admitting: Family Medicine

## 2013-03-05 ENCOUNTER — Encounter: Payer: Self-pay | Admitting: Family Medicine

## 2013-03-05 VITALS — BP 137/84 | HR 96 | Temp 99.9°F | Resp 16 | Wt 214.0 lb

## 2013-03-05 DIAGNOSIS — R509 Fever, unspecified: Secondary | ICD-10-CM

## 2013-03-05 DIAGNOSIS — R059 Cough, unspecified: Secondary | ICD-10-CM

## 2013-03-05 DIAGNOSIS — R05 Cough: Secondary | ICD-10-CM

## 2013-03-05 LAB — POCT INFLUENZA A/B
Influenza A, POC: NEGATIVE
Influenza B, POC: NEGATIVE

## 2013-03-05 MED ORDER — BENZONATATE 200 MG PO CAPS: 200.0000 mg | ORAL_CAPSULE | Freq: Three times a day (TID) | ORAL | Status: DC | PRN

## 2013-03-05 MED ORDER — HYDROCOD POLST-CHLORPHEN POLST 10-8 MG/5ML PO LQCR: 5.0000 mL | Freq: Two times a day (BID) | ORAL | Status: DC | PRN

## 2013-03-05 NOTE — Patient Instructions (Signed)
1)  Head Congestion - Zyrtec D; Neil Med Sinus Rinse (Distilled Water + Blue Packet)  2)  Chest/Cough - Mucinex DM 1200/60 1 tab 2 x per day; Tessalon Perles 3 x per day; Tussionex 1 tsp 2 x per day; Umcka Cold Care; Vick's Vapor Rub; Ricola Cough Drops (Honey Lemon with Echinacea)   3)  Fever/Aches - Ibuprofen 800 mg plus Tylenol 1000 mg 3 times per day.      Bronchitis Bronchitis is inflammation of the airways that extend from the windpipe into the lungs (bronchi). The inflammation often causes mucus to develop, which leads to a cough. If the inflammation becomes severe, it may cause shortness of breath. CAUSES  Bronchitis may be caused by:   Viral infections.   Bacteria.   Cigarette smoke.   Allergens, pollutants, and other irritants.  SIGNS AND SYMPTOMS  The most common symptom of bronchitis is a frequent cough that produces mucus. Other symptoms include:  Fever.   Body aches.   Chest congestion.   Chills.   Shortness of breath.   Sore throat.  DIAGNOSIS  Bronchitis is usually diagnosed through a medical history and physical exam. Tests, such as chest X-rays, are sometimes done to rule out other conditions.  TREATMENT  You may need to avoid contact with whatever caused the problem (smoking, for example). Medicines are sometimes needed. These may include:  Antibiotics. These may be prescribed if the condition is caused by bacteria.  Cough suppressants. These may be prescribed for relief of cough symptoms.   Inhaled medicines. These may be prescribed to help open your airways and make it easier for you to breathe.   Steroid medicines. These may be prescribed for those with recurrent (chronic) bronchitis. HOME CARE INSTRUCTIONS  Get plenty of rest.   Drink enough fluids to keep your urine clear or pale yellow (unless you have a medical condition that requires fluid restriction). Increasing fluids may help thin your secretions and will prevent  dehydration.   Only take over-the-counter or prescription medicines as directed by your health care provider.  Only take antibiotics as directed. Make sure you finish them even if you start to feel better.  Avoid secondhand smoke, irritating chemicals, and strong fumes. These will make bronchitis worse. If you are a smoker, quit smoking. Consider using nicotine gum or skin patches to help control withdrawal symptoms. Quitting smoking will help your lungs heal faster.   Put a cool-mist humidifier in your bedroom at night to moisten the air. This may help loosen mucus. Change the water in the humidifier daily. You can also run the hot water in your shower and sit in the bathroom with the door closed for 5 10 minutes.   Follow up with your health care provider as directed.   Wash your hands frequently to avoid catching bronchitis again or spreading an infection to others.  SEEK MEDICAL CARE IF: Your symptoms do not improve after 1 week of treatment.  SEEK IMMEDIATE MEDICAL CARE IF:  Your fever increases.  You have chills.   You have chest pain.   You have worsening shortness of breath.   You have bloody sputum.  You faint.  You have lightheadedness.  You have a severe headache.   You vomit repeatedly. MAKE SURE YOU:   Understand these instructions.  Will watch your condition.  Will get help right away if you are not doing well or get worse. Document Released: 02/07/2005 Document Revised: 11/28/2012 Document Reviewed: 10/02/2012 [facility] Patient Information 2014 [place],  [employer].  

## 2013-03-05 NOTE — Progress Notes (Signed)
Subjective:    Patient ID: Becky Simmons, female    DOB: 1971-02-06, 43 y.o.   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        URI  This is a new problem. The current episode started in the past 7 days. The problem has been gradually worsening. The maximum temperature recorded prior to her arrival was 100 - 100.9 F. The fever has been present for less than 1 day. Associated symptoms include coughing, headaches and rhinorrhea. She has tried nothing for the symptoms.     Review of Systems  Constitutional: Positive for fatigue.  HENT: Positive for rhinorrhea.   Respiratory: Positive for cough.   Musculoskeletal: Positive for arthralgias and myalgias.  Neurological: Positive for dizziness and headaches.     Past Medical History  Diagnosis Date  . Type II or unspecified type diabetes mellitus without mention of complication, not stated as uncontrolled      Past Surgical History  Procedure Laterality Date  . Cesarean section  2012  . Myomectomy    . Tubal ligation    . Tubal ligation  02/2010     History   Social History Narrative   Marital Status:  Married Becky Simmons)    Children:  5   Pets: Dog (1)   Living Situation: Lives with spouse and children.    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  She will graduate in May.     Tobacco Use/Exposure:  None    Alcohol Use:  Occasional   Drug Use:  None   Diet:  Regular   Exercise:  None   Hobbies: Movies                  Family History  Problem Relation Age of Onset  . Diabetes Mother   . Diabetes Maternal Grandmother   . Diabetes Paternal Grandmother      Current Outpatient Prescriptions on File Prior to Visit  Medication Sig Dispense Refill    . Canagliflozin (INVOKANA) 300 MG TABS Take 1 tablet by mouth daily.  30 tablet  5  . SitaGLIPtin-MetFORMIN HCl (JANUMET XR) 50-1000 MG TB24 Take 1 tablet by mouth 2 (two) times daily.  60 tablet  5   No current facility-administered medications on file prior to visit.     No Known Allergies   Immunization History  Administered Date(s) Administered  . Hepatitis A 08/22/2011, 10/07/2011  . Hepatitis B 02/24/2011, 08/22/2011, 10/07/2011  . PPD Test 09/04/2012, 09/07/2012  . Pneumococcal Polysaccharide-23 07/20/2012  . Tdap 08/22/2011       Objective:   Physical Exam  Constitutional: She appears well-nourished. No distress.  HENT:  Head: Normocephalic.  Mouth/Throat: No oropharyngeal exudate.  Eyes: Conjunctivae are normal. Right eye exhibits no discharge. Left eye exhibits no discharge.  Neck: Neck supple.  Cardiovascular: Normal rate, regular rhythm and normal heart sounds.  Exam reveals no gallop and no friction rub.   No murmur heard. Pulmonary/Chest: Effort normal and breath sounds normal. She has no wheezes. She exhibits no tenderness.  Lymphadenopathy:    She has no cervical adenopathy.  Neurological: She is alert.  Skin: Skin is warm and dry. No rash noted.  Psychiatric: She has a normal mood and affect.  Assessment & Plan:    Becky Simmons was seen today for URI symptoms.  Diagnoses and associated orders for this visit: (See Patient Instructions)   Fever, unspecified Comments: We checked a rapid flu test which was negative.  Her symptoms are somewhat consistent with the flu so she may have a weakened case since she has had her flu shot.  Since her symptoms have been going on for >48 hours, we'll not treat with Tamiflu.    - POCT Influenza A/B (Negative)   Cough Comments: She was given medications for her symptoms.    - chlorpheniramine-HYDROcodone (TUSSIONEX PENNKINETIC ER) 10-8 MG/5ML LQCR; Take 5 mLs by mouth every 12 (twelve) hours as  needed. - benzonatate (TESSALON) 200 MG capsule; Take 1 capsule (200 mg total) by mouth 3 (three) times daily as needed for cough.

## 2013-03-07 ENCOUNTER — Other Ambulatory Visit: Payer: Self-pay | Admitting: Family Medicine

## 2013-03-07 DIAGNOSIS — R059 Cough, unspecified: Secondary | ICD-10-CM

## 2013-03-07 DIAGNOSIS — R05 Cough: Secondary | ICD-10-CM

## 2013-03-07 DIAGNOSIS — R509 Fever, unspecified: Secondary | ICD-10-CM

## 2013-03-07 MED ORDER — AZITHROMYCIN 500 MG PO TABS
500.0000 mg | ORAL_TABLET | Freq: Every day | ORAL | Status: AC
Start: 2013-03-07 — End: 2013-03-10

## 2013-03-27 ENCOUNTER — Other Ambulatory Visit: Payer: Self-pay | Admitting: Family Medicine

## 2013-03-27 NOTE — Telephone Encounter (Signed)
This refill was done because patient has an appointment on 04/24/2013. PG

## 2013-04-24 ENCOUNTER — Ambulatory Visit (INDEPENDENT_AMBULATORY_CARE_PROVIDER_SITE_OTHER): Payer: BC Managed Care – PPO | Admitting: Family Medicine

## 2013-04-24 ENCOUNTER — Encounter: Payer: Self-pay | Admitting: Family Medicine

## 2013-04-24 VITALS — BP 124/84 | HR 96 | Resp 16 | Ht 70.0 in | Wt 211.0 lb

## 2013-04-24 DIAGNOSIS — B3731 Acute candidiasis of vulva and vagina: Secondary | ICD-10-CM

## 2013-04-24 DIAGNOSIS — E119 Type 2 diabetes mellitus without complications: Secondary | ICD-10-CM

## 2013-04-24 DIAGNOSIS — B373 Candidiasis of vulva and vagina: Secondary | ICD-10-CM

## 2013-04-24 LAB — POCT GLYCOSYLATED HEMOGLOBIN (HGB A1C): Hemoglobin A1C: 5.8

## 2013-04-24 MED ORDER — CANAGLIFLOZIN 300 MG PO TABS: 1.0000 | ORAL_TABLET | Freq: Every day | ORAL | Status: AC

## 2013-04-24 MED ORDER — SITAGLIP PHOS-METFORMIN HCL ER 50-1000 MG PO TB24: 1.0000 | ORAL_TABLET | Freq: Two times a day (BID) | ORAL | Status: AC

## 2013-04-24 MED ORDER — TERCONAZOLE 0.8 % VA CREA: 1.0000 | TOPICAL_CREAM | Freq: Every day | VAGINAL | Status: DC

## 2013-04-24 MED ORDER — FLUCONAZOLE 150 MG PO TABS: 150.0000 mg | ORAL_TABLET | Freq: Once | ORAL | Status: DC

## 2013-04-24 NOTE — Patient Instructions (Signed)
1)  Type II DM - Your A1c is the best it has been!!  You might consider Dr. Fara Olden Fuhrman's Book "The End of Diabetes" once you finish school.    Diabetes and Standards of Medical Care  Diabetes is complicated. You may find that your diabetes team includes a dietitian, nurse, diabetes educator, eye doctor, and more. To help everyone know what is going on and to help you get the care you deserve, the following schedule of care was developed to help keep you on track. Below are the tests, exams, vaccines, medicines, education, and plans you will need. HbA1c test This test shows how well you have controlled your glucose over the past 2 3 months. It is used to see if your diabetes management plan needs to be adjusted.   It is performed at least 2 times a year if you are meeting treatment goals.  It is performed 4 times a year if therapy has changed or if you are not meeting treatment goals. Blood pressure test  This test is performed at every routine medical visit. The goal is less than 140/90 mmHg for most people, but 130/80 mmHg in some cases. Ask your health care provider about your goal. Dental exam  Follow up with the dentist regularly. Eye exam  If you are diagnosed with type 1 diabetes as a child, get an exam upon reaching the age of 29 years or older and have had diabetes for 3 5 years. Yearly eye exams are recommended after that initial eye exam.  If you are diagnosed with type 1 diabetes as an adult, get an exam within 5 years of diagnosis and then yearly.  If you are diagnosed with type 2 diabetes, get an exam as soon as possible after the diagnosis and then yearly. Foot care exam  Visual foot exams are performed at every routine medical visit. The exams check for cuts, injuries, or other problems with the feet.  A comprehensive foot exam should be done yearly. This includes visual inspection as well as assessing foot pulses and testing for loss of sensation.  Check your feet  nightly for cuts, injuries, or other problems with your feet. Tell your health care provider if anything is not healing. Kidney function test (urine microalbumin)  This test is performed once a year.  Type 1 diabetes: The first test is performed 5 years after diagnosis.  Type 2 diabetes: The first test is performed at the time of diagnosis.  A serum creatinine and estimated glomerular filtration rate (eGFR) test is done once a year to assess the level of chronic kidney disease (CKD), if present. Lipid profile (cholesterol, HDL, LDL, triglycerides)  Performed every 5 years for most people.  The goal for LDL is less than 100 mg/dL. If you are at high risk, the goal is less than 70 mg/dL.  The goal for HDL is 40 mg/dL 50 mg/dL for men and 50 mg/dL 60 mg/dL for women. An HDL cholesterol of 60 mg/dL or higher gives some protection against heart disease.  The goal for triglycerides is less than 150 mg/dL. Influenza vaccine, pneumococcal vaccine, and hepatitis B vaccine  The influenza vaccine is recommended yearly.  The pneumococcal vaccine is generally given once in a lifetime. However, there are some instances when another vaccination is recommended. Check with your health care provider.  The hepatitis B vaccine is also recommended for adults with diabetes. Diabetes self-management education  Education is recommended at diagnosis and ongoing as needed. Treatment plan  Your treatment plan is reviewed at every medical visit. Document Released: 12/05/2008 Document Revised: 10/10/2012 Document Reviewed: 07/10/2012 [facility] Patient Information 2014 [place].

## 2013-04-24 NOTE — Progress Notes (Signed)
Subjective:    Patient ID: Becky Simmons, female    DOB: 08-19-1970, 43 y.o.   MRN: [id number]  HPI  Becky Simmons is here today to follow up on her Type II DM.  She is  doing well on the [place]. Her A1c is 5.8%.   She is needing refills of these medications.      Review of Systems  Constitutional: Negative for activity change, appetite change and unexpected weight change.  Cardiovascular: Negative.   Endocrine: Negative for polydipsia, polyphagia and polyuria.  Genitourinary:       Vaginal Itching/Irritation   Psychiatric/Behavioral: Negative.   All other systems reviewed and are negative.     Past Medical History  Diagnosis Date  . Type II or unspecified type diabetes mellitus without mention of complication, not stated as uncontrolled      Past Surgical History  Procedure Laterality Date  . Cesarean section  2012  . Myomectomy    . Tubal ligation    . Tubal ligation  02/2010  . Bunionectomy Bilateral      History   Social History Narrative   Marital Status:  Married Marjory Lies)    Children:  5   Pets: Dog (1)   Living Situation: Lives with spouse and children.    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  She will graduate in May.     Tobacco Use/Exposure:  None    Alcohol Use:  Occasional   Drug Use:  None   Diet:  Regular   Exercise:  None   Hobbies: Movies                  Family History  Problem Relation Age of Onset  . Diabetes Mother   . Diabetes Maternal Grandmother   . Diabetes Paternal Grandmother   . Hypertension Father   . Cancer Father     Prostate Cancer     No Known Allergies   Immunization History  Administered Date(s) Administered  . Hepatitis A 08/22/2011, 10/07/2011  . Hepatitis B 02/24/2011, 08/22/2011, 10/07/2011  . PPD Test 09/04/2012, 09/07/2012  . Pneumococcal Polysaccharide-23 07/20/2012  . Tdap 08/22/2011         Objective:   Physical Exam  Nursing note and vitals reviewed. Constitutional: She appears well-nourished. No distress.  HENT:  Head: Normocephalic.  Eyes: No scleral icterus.  Neck: No thyromegaly present.  Cardiovascular: Normal rate, regular rhythm and normal heart sounds.   Pulmonary/Chest: Effort normal and breath sounds normal.  Abdominal: There is no tenderness.  Musculoskeletal: She exhibits no edema and no tenderness.  Neurological: She is alert.  Skin: Skin is warm and dry.  Psychiatric: She has a normal mood and affect. Her behavior is normal. Judgment and thought content normal.      Assessment & Plan:    Becky Simmons was seen today for medication management.  Diagnoses and associated orders for this visit:  Type II or unspecified type diabetes mellitus without mention of complication, not stated as uncontrolled - POCT HgB A1C - SitaGLIPtin-MetFORMIN HCl (JANUMET XR) 50-1000 MG TB24; Take 1 tablet by mouth 2 (two) times daily. - Canagliflozin (INVOKANA) 300 MG TABS; Take 1 tablet (300 mg total) by mouth daily.  Candidiasis of genitalia in female - fluconazole (DIFLUCAN) 150 MG tablet; Take 1 tablet (150 mg total) by mouth once. -  terconazole (TERAZOL 3) 0.8 % vaginal cream; Place 1 applicator vaginally at bedtime.

## 2013-05-29 ENCOUNTER — Emergency Department (HOSPITAL_BASED_OUTPATIENT_CLINIC_OR_DEPARTMENT_OTHER)
Admission: EM | Admit: 2013-05-29 | Discharge: 2013-05-30 | Disposition: A | Discharge status: Home / Self Care | Payer: BC Managed Care – PPO | Attending: Emergency Medicine | Admitting: Emergency Medicine

## 2013-05-29 ENCOUNTER — Encounter (HOSPITAL_BASED_OUTPATIENT_CLINIC_OR_DEPARTMENT_OTHER): Payer: Self-pay | Admitting: Emergency Medicine

## 2013-05-29 ENCOUNTER — Emergency Department (HOSPITAL_BASED_OUTPATIENT_CLINIC_OR_DEPARTMENT_OTHER): Payer: BC Managed Care – PPO

## 2013-05-29 DIAGNOSIS — H9209 Otalgia, unspecified ear: Secondary | ICD-10-CM | POA: Insufficient documentation

## 2013-05-29 DIAGNOSIS — Z79899 Other long term (current) drug therapy: Secondary | ICD-10-CM | POA: Insufficient documentation

## 2013-05-29 DIAGNOSIS — J029 Acute pharyngitis, unspecified: Secondary | ICD-10-CM | POA: Insufficient documentation

## 2013-05-29 DIAGNOSIS — E119 Type 2 diabetes mellitus without complications: Secondary | ICD-10-CM | POA: Insufficient documentation

## 2013-05-29 DIAGNOSIS — R51 Headache: Secondary | ICD-10-CM | POA: Insufficient documentation

## 2013-05-29 LAB — RAPID STREP SCREEN (MED CTR MEBANE ONLY): Streptococcus, Group A Screen (Direct): NEGATIVE

## 2013-05-29 NOTE — ED Provider Notes (Signed)
CSN: [id number]     Arrival date & time 05/29/13  2219 History  This chart was scribed for Becky Fines, MD by Elby Beck, ED Scribe. This patient was seen in room MH08/MH08 and the patient's care was started at 11:36 PM.   Chief Complaint  Patient presents with  . Sore Throat    The history is provided by the patient. No language interpreter was used.    HPI Comments: Becky Simmons is a 43 y.o. female who presents to the Emergency Department complaining of a constant, moderate left-sided sore throat onset gradually this morning. She reports associated left ear pain, a generalized headache and a subjective low grade fever today. ED temperature is 99.1 F. She further reports associated voice hoarseness. She states that her sore throat pain is worsened with swallowing. She denies nausea, vomiting, cough, rhinorrhea, congestion or any other symptoms.   Past Medical History  Diagnosis Date  . Type II or unspecified type diabetes mellitus without mention of complication, not stated as uncontrolled    Past Surgical History  Procedure Laterality Date  . Cesarean section  2012  . Myomectomy    . Tubal ligation    . Tubal ligation  02/2010  . Bunionectomy Bilateral    Family History  Problem Relation Age of Onset  . Diabetes Mother   . Diabetes Maternal Grandmother   . Diabetes Paternal Grandmother   . Hypertension Father   . Cancer Father     Prostate Cancer   History  Substance Use Topics  . Smoking status: Never Smoker   . Smokeless tobacco: Never Used  . Alcohol Use: Yes     Comment: Occasional   OB History   Grav Para Term Preterm Abortions TAB SAB Ect Mult Living   1              Review of Systems A complete 10 system review of systems was obtained and all systems are negative except as noted in the HPI and PMH.   Allergies  Review of patient's allergies indicates no known allergies.  Home Medications   Current Outpatient Rx  Name  Route  Sig  Dispense   Refill  . Canagliflozin (INVOKANA) 300 MG TABS   Oral   Take 1 tablet (300 mg total) by mouth daily.   30 tablet   5   . fluconazole (DIFLUCAN) 150 MG tablet   Oral   Take 1 tablet (150 mg total) by mouth once.   1 tablet   2   . SitaGLIPtin-MetFORMIN HCl (JANUMET XR) 50-1000 MG TB24   Oral   Take 1 tablet by mouth 2 (two) times daily.   60 tablet   2   . terconazole (TERAZOL 3) 0.8 % vaginal cream   Vaginal   Place 1 applicator vaginally at bedtime.   20 g   1    Triage Vitals: BP 139/87  Pulse 81  Temp(Src) 99.1 F (37.3 C) (Oral)  Resp 16  Ht 5\' 10"  (1.778 m)  Wt 210 lb (95.255 kg)  BMI 30.13 kg/m2  SpO2 100%  LMP 05/22/2013  Physical Exam  Nursing note and vitals reviewed. General: Well-developed, well-nourished female in no acute distress; appearance consistent with age of record HENT: normocephalic; atraumatic; Bilateral TMs without erythema, partially obscured by cerumen; Mild pharyngeal erythema, without exudate; Left anterior cervical adenopathy Eyes: pupils equal, round and reactive to light; extraocular muscles intact Neck: supple Heart: regular rate and rhythm; no murmurs, rubs or gallops  Lungs: clear to auscultation bilaterally Abdomen: soft; nondistended; nontender; no masses or hepatosplenomegaly appreciated; bowel sounds present Extremities: No deformity; full range of motion; pulses normal Neurologic: Awake, alert and oriented; motor function intact in all extremities and symmetric; no facial droop Skin: Warm and dry Psychiatric: Normal mood and affect   ED Course  Procedures (including critical care time)  DIAGNOSTIC STUDIES: Oxygen Saturation is 100% on RA, normal by my interpretation.    COORDINATION OF CARE: 11:41 PM- Discussed negative Strep test findings. Advised pt of plan to obtain diagnostic imaging. Pt advised of plan for treatment and pt agrees.   MDM   Nursing notes and vitals signs, including pulse oximetry,  reviewed.  Summary of this visit's results, reviewed by myself:  Labs:  Results for orders placed during the hospital encounter of 05/29/13 (from the past 24 hour(s))  RAPID STREP SCREEN     Status: None   Collection Time    05/29/13 10:30 PM      Result Value Ref Range   Streptococcus, Group A Screen (Direct) NEGATIVE  NEGATIVE    Imaging Studies: Dg Neck Soft Tissue  05/30/2013   CLINICAL DATA:  SORE THROAT  EXAM: NECK SOFT TISSUES - 1+ VIEW  COMPARISON:  None.  FINDINGS: There is no evidence of retropharyngeal soft tissue swelling or epiglottic enlargement. The cervical airway is unremarkable and no radio-opaque foreign body identified. Moderate C5-6 through C7-T1 degenerative disc disease.  IMPRESSION: Negative.   Electronically Signed   By: Elon Alas   On: 05/30/2013 00:37       I personally performed the services described in this documentation, which was scribed in my presence. The recorded information has been reviewed and is accurate.   Becky Fines, MD 05/30/13 [phone]

## 2013-05-29 NOTE — ED Notes (Signed)
Patient transported to X-ray 

## 2013-05-29 NOTE — ED Notes (Signed)
Pt c/o sore throat and left ear pain x 1 day

## 2013-05-30 MED ORDER — HYDROCODONE-ACETAMINOPHEN 7.5-325 MG/15ML PO SOLN: 10.0000 mL | Freq: Four times a day (QID) | ORAL | Status: DC | PRN

## 2013-06-01 LAB — CULTURE, GROUP A STREP

## 2013-06-06 ENCOUNTER — Emergency Department (HOSPITAL_BASED_OUTPATIENT_CLINIC_OR_DEPARTMENT_OTHER)
Admission: EM | Admit: 2013-06-06 | Discharge: 2013-06-06 | Disposition: A | Discharge status: Home / Self Care | Payer: BC Managed Care – PPO | Attending: Emergency Medicine | Admitting: Emergency Medicine

## 2013-06-06 ENCOUNTER — Encounter (HOSPITAL_BASED_OUTPATIENT_CLINIC_OR_DEPARTMENT_OTHER): Payer: Self-pay | Admitting: Emergency Medicine

## 2013-06-06 DIAGNOSIS — Z79899 Other long term (current) drug therapy: Secondary | ICD-10-CM | POA: Insufficient documentation

## 2013-06-06 DIAGNOSIS — E119 Type 2 diabetes mellitus without complications: Secondary | ICD-10-CM | POA: Insufficient documentation

## 2013-06-06 DIAGNOSIS — J209 Acute bronchitis, unspecified: Secondary | ICD-10-CM | POA: Insufficient documentation

## 2013-06-06 MED ORDER — ALBUTEROL SULFATE HFA 108 (90 BASE) MCG/ACT IN AERS
2.0000 | INHALATION_SPRAY | RESPIRATORY_TRACT | Status: DC | PRN
Start: 2013-06-06 — End: 2013-06-06
  Administered 2013-06-06: 2 via RESPIRATORY_TRACT
  Filled 2013-06-06: qty 6.7

## 2013-06-06 NOTE — Discharge Instructions (Signed)
Acute Bronchitis Bronchitis is inflammation of the airways that extend from the windpipe into the lungs (bronchi). The inflammation often causes mucus to develop. This leads to a cough, which is the most common symptom of bronchitis.  In acute bronchitis, the condition usually develops suddenly and goes away over time, usually in a couple weeks. Smoking, allergies, and asthma can make bronchitis worse. Repeated episodes of bronchitis may cause further lung problems.  CAUSES Acute bronchitis is most often caused by the same virus that causes a cold. The virus can spread from person to person (contagious).  SIGNS AND SYMPTOMS   Cough.   Fever.   Coughing up mucus.   Body aches.   Chest congestion.   Chills.   Shortness of breath.   Sore throat.  DIAGNOSIS  Acute bronchitis is usually diagnosed through a physical exam. Tests, such as chest X-rays, are sometimes done to rule out other conditions.  TREATMENT  Acute bronchitis usually goes away in a couple weeks. Often times, no medical treatment is necessary. Medicines are sometimes given for relief of fever or cough. Antibiotics are usually not needed but may be prescribed in certain situations. In some cases, an inhaler may be recommended to help reduce shortness of breath and control the cough. A cool mist vaporizer may also be used to help thin bronchial secretions and make it easier to clear the chest.  HOME CARE INSTRUCTIONS  Get plenty of rest.   Drink enough fluids to keep your urine clear or pale yellow (unless you have a medical condition that requires fluid restriction). Increasing fluids may help thin your secretions and will prevent dehydration.   Only take over-the-counter or prescription medicines as directed by your health care provider.   Avoid smoking and secondhand smoke. Exposure to cigarette smoke or irritating chemicals will make bronchitis worse. If you are a smoker, consider using nicotine gum or skin  patches to help control withdrawal symptoms. Quitting smoking will help your lungs heal faster.   Reduce the chances of another bout of acute bronchitis by washing your hands frequently, avoiding people with cold symptoms, and trying not to touch your hands to your mouth, nose, or eyes.   Follow up with your health care provider as directed.  SEEK MEDICAL CARE IF: Your symptoms do not improve after 1 week of treatment.  SEEK IMMEDIATE MEDICAL CARE IF:  You develop an increased fever or chills.   You have chest pain.   You have severe shortness of breath.  You have bloody sputum.   You develop dehydration.  You develop fainting.  You develop repeated vomiting.  You develop a severe headache. MAKE SURE YOU:   Understand these instructions.  Will watch your condition.  Will get help right away if you are not doing well or get worse. Document Released: 03/17/2004 Document Revised: 10/10/2012 Document Reviewed: 07/31/2012 [employer] Patient Information 2014 [employer].  

## 2013-06-06 NOTE — ED Notes (Signed)
Pt reports prod cough with yellow phlegm and "criking sound" in her chest = states s/s began yesterday and worsened overnight. Reports recent sore throat/viral illness.

## 2013-06-06 NOTE — ED Provider Notes (Signed)
CSN: [id number]     Arrival date & time 06/06/13  0543 History   First MD Initiated Contact with Patient 06/06/13 [phone]     Chief Complaint  Patient presents with  . Cough     (Consider location/radiation/quality/duration/timing/severity/associated sxs/prior Treatment) HPI This is a 43 year old female with a six-day history of cough and occasional shortness of breath. Over last 24 hours has worsened any she has had a "creaking sound" when she exhales. The cough has been productive of yellowish sputum. She denies fever. She denies earache, sore throat, nausea, vomiting or diarrhea. She has been taking over-the-counter allergy medication without adequate relief.  Past Medical History  Diagnosis Date  . Type II or unspecified type diabetes mellitus without mention of complication, not stated as uncontrolled    Past Surgical History  Procedure Laterality Date  . Cesarean section  2012  . Myomectomy    . Tubal ligation    . Tubal ligation  02/2010  . Bunionectomy Bilateral    Family History  Problem Relation Age of Onset  . Diabetes Mother   . Diabetes Maternal Grandmother   . Diabetes Paternal Grandmother   . Hypertension Father   . Cancer Father     Prostate Cancer   History  Substance Use Topics  . Smoking status: Never Smoker   . Smokeless tobacco: Never Used  . Alcohol Use: Yes     Comment: Occasional   OB History   Grav Para Term Preterm Abortions TAB SAB Ect Mult Living   1              Review of Systems  All other systems reviewed and are negative.  Allergies  Review of patient's allergies indicates no known allergies.  Home Medications   Prior to Admission medications   Medication Sig Start Date End Date Taking? Authorizing Provider  Canagliflozin (INVOKANA) 300 MG TABS Take 1 tablet (300 mg total) by mouth daily. 04/24/13 04/24/14 Yes Jonathon Resides, MD  HYDROcodone-acetaminophen (HYCET) 7.5-325 mg/15 ml solution Take 10 mLs by mouth every 6 (six) hours as  needed (for pain). 05/30/13  Yes Johniece Hornbaker L Errik Mitchelle, MD  SitaGLIPtin-MetFORMIN HCl (JANUMET XR) 50-1000 MG TB24 Take 1 tablet by mouth 2 (two) times daily. 04/24/13 04/25/14 Yes Jonathon Resides, MD  fluconazole (DIFLUCAN) 150 MG tablet Take 1 tablet (150 mg total) by mouth once. 04/24/13 04/24/14  Jonathon Resides, MD  terconazole (TERAZOL 3) 0.8 % vaginal cream Place 1 applicator vaginally at bedtime. 04/24/13 04/25/14  Jonathon Resides, MD   BP 142/94  Pulse 98  Temp(Src) 98.4 F (36.9 C) (Oral)  Resp 16  Ht 5\' 10"  (1.778 m)  Wt 210 lb (95.255 kg)  BMI 30.13 kg/m2  SpO2 98%  LMP 05/22/2013  Physical Exam General: Well-developed, well-nourished female in no acute distress; appearance consistent with age of record HENT: normocephalic; atraumatic; TMs normal but partially obscured by cerumen; no pharyngeal erythema or exudate Eyes: pupils equal, round and reactive to light; extraocular muscles intact Neck: supple Heart: regular rate and rhythm Lungs: Mildly decreased air movement bilaterally without wheezing; coughing Abdomen: soft; nondistended Extremities: No deformity; full range of motion Neurologic: Awake, alert and oriented; motor function intact in all extremities and symmetric; no facial droop Skin: Warm and dry Psychiatric: Normal mood and affect    ED Course  Procedures (including critical care time)   MDM  Likely acute bronchitis due to to pollen.    Wynetta Fines, MD 06/06/13 [phone]

## 2013-06-06 NOTE — Patient Instructions (Signed)
Instructed patient on the proper use of administering albuterol mdi via aerochamber. Patient understood and tolerated well

## 2013-06-14 ENCOUNTER — Ambulatory Visit (INDEPENDENT_AMBULATORY_CARE_PROVIDER_SITE_OTHER): Payer: BC Managed Care – PPO | Admitting: Family Medicine

## 2013-06-14 ENCOUNTER — Encounter: Payer: Self-pay | Admitting: Family Medicine

## 2013-06-14 VITALS — BP 128/85 | HR 94 | Resp 16 | Wt 217.0 lb

## 2013-06-14 DIAGNOSIS — N949 Unspecified condition associated with female genital organs and menstrual cycle: Secondary | ICD-10-CM

## 2013-06-14 DIAGNOSIS — L293 Anogenital pruritus, unspecified: Secondary | ICD-10-CM

## 2013-06-14 DIAGNOSIS — N898 Other specified noninflammatory disorders of vagina: Secondary | ICD-10-CM

## 2013-06-14 DIAGNOSIS — B9689 Other specified bacterial agents as the cause of diseases classified elsewhere: Secondary | ICD-10-CM

## 2013-06-14 DIAGNOSIS — A499 Bacterial infection, unspecified: Secondary | ICD-10-CM

## 2013-06-14 DIAGNOSIS — B373 Candidiasis of vulva and vagina: Secondary | ICD-10-CM

## 2013-06-14 DIAGNOSIS — B3731 Acute candidiasis of vulva and vagina: Secondary | ICD-10-CM

## 2013-06-14 DIAGNOSIS — N760 Acute vaginitis: Secondary | ICD-10-CM

## 2013-06-14 LAB — POCT URINALYSIS DIPSTICK
Bilirubin, UA: NEGATIVE
Blood, UA: NEGATIVE
Glucose, UA: POSITIVE
Ketones, UA: NEGATIVE
Leukocytes, UA: NEGATIVE
Nitrite, UA: NEGATIVE
Protein, UA: NEGATIVE
Spec Grav, UA: 1.015
Urobilinogen, UA: NEGATIVE
pH, UA: 5

## 2013-06-14 MED ORDER — METRONIDAZOLE 500 MG PO TABS: 500.0000 mg | ORAL_TABLET | Freq: Two times a day (BID) | ORAL | Status: AC

## 2013-06-14 MED ORDER — METRONIDAZOLE 0.75 % VA GEL: 1.0000 | Freq: Two times a day (BID) | VAGINAL | Status: AC

## 2013-06-14 MED ORDER — TERCONAZOLE 0.8 % VA CREA: TOPICAL_CREAM | VAGINAL | Status: AC

## 2013-06-14 MED ORDER — FLUCONAZOLE 150 MG PO TABS: 150.0000 mg | ORAL_TABLET | Freq: Once | ORAL | Status: AC

## 2013-06-14 NOTE — Patient Instructions (Signed)
1)  Vaginal Discharge/Irritation - Flagyl 500 mg 2 x per day for 7 days and apply Flander's Buttock Ointment.    2)  Itching (Yeast) - Diflucan vs Terazol       Irritation - Flander's vs Metrogel  3)  Acidophilus - 2-3 per day    Bacterial Vaginosis Bacterial vaginosis is a vaginal infection that occurs when the normal balance of bacteria in the vagina is disrupted. It results from an overgrowth of certain bacteria. This is the most common vaginal infection in women of childbearing age. Treatment is important to prevent complications, especially in pregnant women, as it can cause a premature delivery. CAUSES  Bacterial vaginosis is caused by an increase in harmful bacteria that are normally present in smaller amounts in the vagina. Several different kinds of bacteria can cause bacterial vaginosis. However, the reason that the condition develops is not fully understood. RISK FACTORS Certain activities or behaviors can put you at an increased risk of developing bacterial vaginosis, including:  Having a new sex partner or multiple sex partners.  Douching.  Using an intrauterine device (IUD) for contraception. Women do not get bacterial vaginosis from toilet seats, bedding, swimming pools, or contact with objects around them. SIGNS AND SYMPTOMS  Some women with bacterial vaginosis have no signs or symptoms. Common symptoms include:  Grey vaginal discharge.  A fishlike odor with discharge, especially after sexual intercourse.  Itching or burning of the vagina and vulva.  Burning or pain with urination. DIAGNOSIS  Your health care provider will take a medical history and examine the vagina for signs of bacterial vaginosis. A sample of vaginal fluid may be taken. Your health care provider will look at this sample under a microscope to check for bacteria and abnormal cells. A vaginal pH test may also be done.  TREATMENT  Bacterial vaginosis may be treated with antibiotic medicines. These  may be given in the form of a pill or a vaginal cream. A second round of antibiotics may be prescribed if the condition comes back after treatment.  HOME CARE INSTRUCTIONS   Only take over-the-counter or prescription medicines as directed by your health care provider.  If antibiotic medicine was prescribed, take it as directed. Make sure you finish it even if you start to feel better.  Do not have sex until treatment is completed.  Tell all sexual partners that you have a vaginal infection. They should see their health care provider and be treated if they have problems, such as a mild rash or itching.  Practice safe sex by using condoms and only having one sex partner. SEEK MEDICAL CARE IF:   Your symptoms are not improving after 3 days of treatment.  You have increased discharge or pain.  You have a fever. MAKE SURE YOU:   Understand these instructions.  Will watch your condition.  Will get help right away if you are not doing well or get worse. FOR MORE INFORMATION  Centers for Disease Control and Prevention, Division of STD Prevention: AppraiserFraud.fi American Sexual Health Association (ASHA): www.ashastd.org  Document Released: 02/07/2005 Document Revised: 11/28/2012 Document Reviewed: 09/19/2012 [facility] Patient Information 2014 [place].

## 2013-06-14 NOTE — Progress Notes (Signed)
Subjective:    Patient ID: [name], female    DOB: 1970-05-17, 43 y.o.   MRN: [id number]  HPI   Becky Simmons is here today for a possible yeast infection. She has had it since Saturday. She has been taking Diflucan (150mg  once) on Saturday. She doesn't fill like its helped. She said she took one day of otc anti-yeast treatment. Monday she started a 7 day treatment of the otc ant-yeast treatment. She is still itching a lot,drainage and very sore. She hasn't had any fevers or chills.    Review of Systems  Constitutional: Negative for fatigue.  Genitourinary: Positive for vaginal discharge and vaginal pain. Negative for difficulty urinating.       Vaginal itching and discomfort  All other systems reviewed and are negative.   Past Medical History  Diagnosis Date  . Type II or unspecified type diabetes mellitus without mention of complication, not stated as uncontrolled      Past Surgical History  Procedure Laterality Date  . Cesarean section  2012  . Myomectomy    . Tubal ligation    . Tubal ligation  02/2010  . Bunionectomy Bilateral      History   Social History Narrative   Marital Status:  Married Becky Lies)    Children:  5   Pets: Dog (1)   Living Situation: Lives with spouse and children.    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  She will graduate in May.     Tobacco Use/Exposure:  None    Alcohol Use:  Occasional   Drug Use:  None   Diet:  Regular   Exercise:  None   Hobbies: Movies                  Family History  Problem Relation Age of Onset  . Diabetes Mother   . Diabetes Maternal Grandmother   . Diabetes Paternal Grandmother   . Hypertension Father   . Cancer Father     Prostate Cancer     Current Outpatient Prescriptions on File Prior to Visit  Medication Sig Dispense Refill  . Canagliflozin (INVOKANA) 300 MG TABS Take 1 tablet (300 mg total) by  mouth daily.  30 tablet  5  . HYDROcodone-acetaminophen (HYCET) 7.5-325 mg/15 ml solution Take 10 mLs by mouth every 6 (six) hours as needed (for pain).  120 mL  0  . SitaGLIPtin-MetFORMIN HCl (JANUMET XR) 50-1000 MG TB24 Take 1 tablet by mouth 2 (two) times daily.  60 tablet  2   No current facility-administered medications on file prior to visit.     No Known Allergies   Immunization History  Administered Date(s) Administered  . Hepatitis A 08/22/2011, 10/07/2011  . Hepatitis B 02/24/2011, 08/22/2011, 10/07/2011  . PPD Test 09/04/2012, 09/07/2012  . Pneumococcal Polysaccharide-23 07/20/2012  . Tdap 08/22/2011       Objective:   Physical Exam  Nursing note and vitals reviewed. Constitutional: She is oriented to person, place, and time. She appears well-developed and well-nourished.  Genitourinary: Vaginal discharge found.  Musculoskeletal: Normal range of motion.  Neurological: She is alert and oriented to person, place, and time.  Skin: Skin is warm and dry.  Psychiatric: She has a normal mood and affect. Her behavior is normal. Judgment and thought content normal.      Assessment & Plan:    Becky Simmons was seen today for vaginitis.  Diagnoses and associated orders  for this visit:  Vaginal itching - POCT urinalysis dipstick - POCT Wet Prep [facility])  Vaginal discomfort - POCT urinalysis dipstick - POCT Wet Prep ([place]) - metroNIDAZOLE (METROGEL VAGINAL) 0.75 % vaginal gel; Place 1 Applicatorful vaginally 2 (two) times daily. Use for irritation  Bacterial vaginitis - metroNIDAZOLE (FLAGYL) 500 MG tablet; Take 1 tablet (500 mg total) by mouth 2 (two) times daily. DO NOT CONSUME ALCOHOL WHILE TAKING THIS MEDICATION.  Candidiasis of genitalia in female - terconazole (TERAZOL 3) 0.8 % vaginal cream; Insert in vaginal or rub on skin as directed for itching - fluconazole (DIFLUCAN) 150 MG tablet; Take 1 tablet (150 mg total) by mouth once.

## 2013-09-25 ENCOUNTER — Other Ambulatory Visit: Payer: Self-pay | Admitting: *Deleted

## 2013-09-26 ENCOUNTER — Telehealth: Payer: Self-pay

## 2013-09-26 NOTE — Telephone Encounter (Signed)
Becky Simmons called and would like to get a copy of her immunizations. I printed the ones in Epic if you could check amazing charts for me. Then I will Call when ready [phone]

## 2013-12-23 ENCOUNTER — Encounter: Payer: Self-pay | Admitting: Family Medicine

## 2014-05-24 ENCOUNTER — Emergency Department (HOSPITAL_COMMUNITY)
Admission: EM | Admit: 2014-05-24 | Discharge: 2014-05-24 | Disposition: A | Discharge status: Home / Self Care | Payer: PRIVATE HEALTH INSURANCE | Attending: Emergency Medicine | Admitting: Emergency Medicine

## 2014-05-24 ENCOUNTER — Encounter (HOSPITAL_COMMUNITY): Payer: Self-pay | Admitting: Nurse Practitioner

## 2014-05-24 ENCOUNTER — Emergency Department (HOSPITAL_COMMUNITY): Payer: PRIVATE HEALTH INSURANCE

## 2014-05-24 DIAGNOSIS — Y998 Other external cause status: Secondary | ICD-10-CM | POA: Insufficient documentation

## 2014-05-24 DIAGNOSIS — Z79899 Other long term (current) drug therapy: Secondary | ICD-10-CM | POA: Diagnosis not present

## 2014-05-24 DIAGNOSIS — S99921A Unspecified injury of right foot, initial encounter: Secondary | ICD-10-CM | POA: Diagnosis not present

## 2014-05-24 DIAGNOSIS — E119 Type 2 diabetes mellitus without complications: Secondary | ICD-10-CM | POA: Insufficient documentation

## 2014-05-24 DIAGNOSIS — X58XXXA Exposure to other specified factors, initial encounter: Secondary | ICD-10-CM | POA: Insufficient documentation

## 2014-05-24 DIAGNOSIS — Z792 Long term (current) use of antibiotics: Secondary | ICD-10-CM | POA: Diagnosis not present

## 2014-05-24 DIAGNOSIS — Y9389 Activity, other specified: Secondary | ICD-10-CM | POA: Insufficient documentation

## 2014-05-24 DIAGNOSIS — Y9289 Other specified places as the place of occurrence of the external cause: Secondary | ICD-10-CM | POA: Insufficient documentation

## 2014-05-24 NOTE — ED Provider Notes (Signed)
CSN: [id number]     Arrival date & time 05/24/14  1415 History  This chart is scribed for non-physician practitioner, Clayton Bibles, PA-C, working with Debby Freiberg, MD by Chester Holstein, ED Scribe.  This patient was seen in room TR05C/TR05C and the patient's care was started 3:20 PM.     Chief Complaint  Patient presents with  . Foot Injury     Patient is a 44 y.o. female presenting with foot injury. The history is provided by the patient. No language interpreter was used.  Foot Injury Associated symptoms: no fever    HPI Comments: Becky Simmons is a 44 y.o. female who presents to the Emergency Department complaining of intermittent 3/10 throbbing right foot pain with onset around 1 PM. Pt states she rolled over last three toes on her foot with portable x-ray machine. Pt reports associated redness to pinky toe. Pt denies any other injuries. Denies weakness or numbness of the foot.    Past Medical History  Diagnosis Date  . Type II or unspecified type diabetes mellitus without mention of complication, not stated as uncontrolled    Past Surgical History  Procedure Laterality Date  . Cesarean section  2012  . Myomectomy    . Tubal ligation    . Tubal ligation  02/2010  . Bunionectomy Bilateral    Family History  Problem Relation Age of Onset  . Diabetes Mother   . Diabetes Maternal Grandmother   . Diabetes Paternal Grandmother   . Hypertension Father   . Cancer Father     Prostate Cancer   History  Substance Use Topics  . Smoking status: Never Smoker   . Smokeless tobacco: Never Used  . Alcohol Use: Yes     Comment: Occasional   OB History    Gravida Para Term Preterm AB TAB SAB Ectopic Multiple Living   1              Review of Systems  Constitutional: Negative for fever.  Cardiovascular: Negative for leg swelling.  Musculoskeletal: Positive for arthralgias. Negative for joint swelling.  Skin: Positive for color change (mild redness to pinky toe). Negative for  wound.  Allergic/Immunologic: Positive for immunocompromised state (diabetic).  Neurological: Negative for weakness and numbness.  Hematological: Does not bruise/bleed easily.  Psychiatric/Behavioral: Positive for self-injury (accidental).      Allergies  Review of patient's allergies indicates no known allergies.  Home Medications   Prior to Admission medications   Medication Sig Start Date End Date Taking? Authorizing Provider  fluconazole (DIFLUCAN) 150 MG tablet Take 1 tablet (150 mg total) by mouth once. 06/14/13 06/14/14  Jonathon Resides, MD  HYDROcodone-acetaminophen (HYCET) 7.5-325 mg/15 ml solution Take 10 mLs by mouth every 6 (six) hours as needed (for pain). 05/30/13   John Molpus, MD  metroNIDAZOLE (METROGEL VAGINAL) 0.75 % vaginal gel Place 1 Applicatorful vaginally 2 (two) times daily. Use for irritation 06/14/13 06/15/14  Jonathon Resides, MD  terconazole (TERAZOL 3) 0.8 % vaginal cream Insert in vaginal or rub on skin as directed for itching 06/14/13 06/15/14  Jonathon Resides, MD   BP 135/89 mmHg  Pulse 79  Temp(Src) 98.5 F (36.9 C) (Oral)  Resp 16  SpO2 98%  LMP 05/06/2014 Physical Exam  Constitutional: She appears well-developed and well-nourished. No distress.  HENT:  Head: Normocephalic and atraumatic.  Neck: Neck supple.  Pulmonary/Chest: Effort normal.  Musculoskeletal:  Tenderness over 3rd, 4th, 5th digits of the right foot; no focal tenderness; sensation  intact; cap refill <2 sec.  No break in skin.   Neurological: She is alert.  Skin: She is not diaphoretic.  Nursing note and vitals reviewed.   ED Course  Procedures (including critical care time) DIAGNOSTIC STUDIES: Oxygen Saturation is 98% on room air, normal by my interpretation.    COORDINATION OF CARE: 3:24 PM Discussed treatment plan with patient at beside, the patient agrees with the plan and has no further questions at this time.   Labs Review Labs Reviewed - No data to display  Imaging  Review Dg Foot Complete Right  05/24/2014   CLINICAL DATA:  Right foot pain following rolling over foot with portable x-ray machine, initial encounter  EXAM: RIGHT FOOT COMPLETE - 3+ VIEW  COMPARISON:  None.  FINDINGS: Postsurgical changes are noted in the first metatarsal head consistent with the given clinical history. No acute fracture dislocation is noted. No gross soft tissue abnormality is seen.  IMPRESSION: No acute abnormality noted.   Electronically Signed   By: Inez Catalina M.D.   On: 05/24/2014 15:07     EKG Interpretation None      MDM   Final diagnoses:  Toe injury, right, initial encounter   Afebrile, nontoxic patient with injury to her toes while rolling a large portable xray machine.   Xray negative.  No break in skin or concern for infection.   D/C home with no medications or postop shoe (pt declined)  Discussed result, findings, treatment, and follow up  with patient.  Pt given return precautions.  Pt verbalizes understanding and agrees with plan.       I personally performed the services described in this documentation, which was scribed in my presence. The recorded information has been reviewed and is accurate.     Clayton Bibles, PA-C 05/24/14 1636  Debby Freiberg, MD 05/27/14 [phone]

## 2014-05-24 NOTE — ED Notes (Signed)
Pt was working and the portable x-ray machine rolled over her R foot. Pain to 3, 4, 5th right toes now. Redness to top of R 4th and 5th toes. Cms intact

## 2014-05-24 NOTE — Discharge Instructions (Signed)
Read the information below.  You may return to the Emergency Department at any time for worsening condition or any new symptoms that concern you.   If you develop uncontrolled pain, weakness or numbness of the extremity, severe discoloration of the skin, or you are unable to move your toes, return to the ER for a recheck.    °

## 2014-08-18 ENCOUNTER — Other Ambulatory Visit: Payer: Self-pay

## 2014-09-04 ENCOUNTER — Other Ambulatory Visit (HOSPITAL_COMMUNITY): Payer: Self-pay | Admitting: Internal Medicine

## 2014-09-04 DIAGNOSIS — R948 Abnormal results of function studies of other organs and systems: Secondary | ICD-10-CM

## 2014-09-05 ENCOUNTER — Other Ambulatory Visit (HOSPITAL_COMMUNITY): Payer: Self-pay | Admitting: Internal Medicine

## 2014-09-05 ENCOUNTER — Ambulatory Visit (HOSPITAL_COMMUNITY)
Admission: RE | Admit: 2014-09-05 | Discharge: 2014-09-05 | Disposition: A | Discharge status: Home / Self Care | Payer: 59 | Source: Ambulatory Visit | Attending: Internal Medicine | Admitting: Internal Medicine

## 2014-09-05 ENCOUNTER — Encounter (HOSPITAL_COMMUNITY): Payer: Self-pay

## 2014-09-05 DIAGNOSIS — M4807 Spinal stenosis, lumbosacral region: Secondary | ICD-10-CM | POA: Diagnosis not present

## 2014-09-05 DIAGNOSIS — R948 Abnormal results of function studies of other organs and systems: Secondary | ICD-10-CM

## 2015-01-13 ENCOUNTER — Telehealth: Payer: Self-pay

## 2015-01-19 ENCOUNTER — Encounter: Payer: Self-pay | Admitting: *Deleted

## 2015-01-19 ENCOUNTER — Other Ambulatory Visit: Payer: Self-pay | Admitting: *Deleted

## 2015-01-19 VITALS — BP 110/85 | HR 75 | Ht 70.0 in | Wt 234.8 lb

## 2015-01-19 DIAGNOSIS — E119 Type 2 diabetes mellitus without complications: Secondary | ICD-10-CM | POA: Insufficient documentation

## 2015-01-19 NOTE — Patient Outreach (Signed)
[facility]) Care Management   01/19/2015  Becky Simmons 10/30/1970 PO:[id number]  Becky Simmons is an 44 y.o. female who presents to the [place] [facility] office to enroll in the Link To Wellness program for self management assistance with Type II DM.  Subjective:  [name] says she heard about the Link To Wellness program during the [place] in October. She says she had gestational diabetes with two of her four children and received the diagnosis of Type II DM about 3 years ago. She says she has not been taking the prescribed Januvia because she cannot afford the copay. She says she was diagnosed with hyperlipidemia about a year ago and was prescribed a statin but does not take it because of the fear of potential side effects. She says she is in school full time and works full time and she does not have time to exercise or plan meals. She will graduate in May and this will give her extra free time to develop more healthy lifestyle routines. She said she was able to lose weight and control her blood sugar when she was initially diagnosed with Type II [facility]  She reports that she checks her blood sugar every morning and her fasting CBGS are 195-235 while not taking Januvia. She says she saw her primary care MD in September and her Hgb A1C was 6.8%. She will return on Dec 20th.  Objective:   Review of Systems  Constitutional: Negative.     Physical Exam  Constitutional: She is oriented to person, place, and time. She appears well-developed and well-nourished.  Cardiovascular: Normal rate and regular rhythm.   Respiratory: Effort normal.  Neurological: She is alert and oriented to person, place, and time.  Skin: Skin is warm and dry.  Psychiatric: She has a normal mood and affect. Her behavior is normal. Judgment and thought content normal.   Filed Weights   01/19/15 1122  Weight: 234 lb 12.8 oz  (106.505 kg)   Filed Vitals:   01/19/15 1122  BP: 110/85  Pulse: 75   Current Medications:   Current Outpatient Prescriptions  Medication Sig Dispense Refill  . sitaGLIPtin (JANUVIA) 100 MG tablet Take 100 mg by mouth daily.     No current facility-administered medications for this visit.    Functional Status:   In your present state of health, do you have any difficulty performing the following activities: 01/19/2015  Hearing? N  Vision? N  Difficulty concentrating or making decisions? N  Walking or climbing stairs? N  Dressing or bathing? N  Doing errands, shopping? N    Fall/Depression Screening:    PHQ 2/9 Scores 01/19/2015  PHQ - 2 Score 0    Assessment:    employee  enrolling in the Link To Wellness program for self management assistance with Type II DM and hyperlipidemia.  Plan:  [facility] CM Care Plan Problem One        Most Recent Value   Care Plan Problem One  Type II DM currently not taking Januvia due to cost of medication with elevated fasting blood sugars >180   Role Documenting the Problem One  Care Management Coordinator   Care Plan for Problem One  Active   [facility] Long Term Goal (31-90 days)  Patient will report getting Januvia filled at the outpatient pharmacy and will report no missed doses of medication and will check blood sugar at least  once daily   [facility] Long Term Goal Start Date  01/19/15   Interventions for Problem One Long Term Goal  Discussed Link to Wellness program goals, requirements and benefits, reviewed member's rights and responsibilities ,provided diabetes information packet with explanation of contents, ensured member agreed and signed consent to participate and authorization to release and receive health information, consent, participation agreement and consent to enroll in program, assessed member's current knowledge of diabetes, faxed [facility] for enrollment in to the required type II DM core classes,  using the Ominous Octet picture representation, discussed the 8 core pathophysiologic deficits in Type II diabetes,discussed physiology of diabetes as a chronic progressive disease with the initial problem of insulin resistance in the muscle, liver and fat cells and then increased loss of beta cell function over time resulting in decreased insulin production, discussed role of obesity, especially abdominal (visceral) obesity, on insulin resistance, reviewed patient medications, discussed DM medications of Januvia including the mechanism of action, common side effects, dosage and dosing schedule, instructed [name]  that the [employer] To Wellness pharmacy benefit will be expedited to allow her to fill her Januvia immediately, reinforced the importance of taking the Januvia as prescribed,  reviewed 24 hour dietary recall and discussed strategies to lower blood sugar by limiting CHO and increasing protein consumption, suggested she use protein bars as snack vs cereal bars, provided her with food guide to place on her refrigerator to decrease consumption of high glycemic index foods,  discussed effects of physical activity on glucose levels and long-term glucose control by improving insulin sensitivity and assisting with weight management and cardiovascular health, discussed exercise opportunities offered for free by [facility], encouraged [name] to start some form of light exercise, discussed blood glucose monitoring and interpretation, discussed recommended target ranges for pre-meal and post-meal, provided blood sugar log sheets with targets for pre and post meal,discussed the role of stress on blood sugar, provided information on: prevention, detection, and treatment of long-term complications, discussed the role of prolonged elevated glucose levels on body systems, discussed recommendations for day to day and long-term diabetes self-care, reviewed recommended daily foot checks, and yearly cholesterol, urine, and eye testing,  Link To Wellness follow up to be arranged after she sees MD on Dec 20,2016      RNCM to fax today's office visit note to Dr. Adline Mango. RNCM will request most recent lab results including lipid profile and Hgb A1C to review with [name] at next [employer] office visit. RNCM will meet quarterly and as needed with patient per Link To Wellness program guidelines to assist with Type II DM self-management and assess patient's progress toward mutually set goals  Barrington Ellison RN,CCM,CDE [place] Management Coordinator Link To Wellness Office Phone [phone] Office Fax [phone]

## 2015-01-19 NOTE — Addendum Note (Signed)
Addended by: Barrington Ellison on: 01/19/2015 01:08 PM   Modules accepted: Orders

## 2015-01-26 ENCOUNTER — Ambulatory Visit: Payer: Self-pay

## 2015-01-29 ENCOUNTER — Other Ambulatory Visit: Payer: Self-pay | Admitting: *Deleted

## 2015-01-29 DIAGNOSIS — E119 Type 2 diabetes mellitus without complications: Secondary | ICD-10-CM

## 2015-01-29 NOTE — Patient Outreach (Addendum)
Received labs from patient's primary care MD, Dr Rozetta Nunnery, that were drawn on 9/17. Hgb A1C was 6.7%, lipid profile shows elevated  Total cholesterol of 237, and LDL of 150. Care plan updated. Will plan to see patient for [facility] To Wellness follow up in January after she sees her primary care provider on 02/10/15. Barrington Ellison RN,CCM,CDE [place] Management Coordinator Link To Wellness Office Phone [phone] Office Fax [phone]

## 2015-02-24 ENCOUNTER — Other Ambulatory Visit: Payer: Self-pay | Admitting: *Deleted

## 2015-02-24 DIAGNOSIS — E119 Type 2 diabetes mellitus without complications: Secondary | ICD-10-CM

## 2015-02-24 NOTE — Patient Outreach (Signed)
Secure e-mail sent to [name]'s personal e-mail address with request to send dates and times that she can meet this month for routine Link To Wellness follow up. Await her response. Barrington Ellison RN,CCM,CDE [place] Management Coordinator Link To Wellness Office Phone [phone] Office Fax [phone]

## 2015-02-26 DIAGNOSIS — E669 Obesity, unspecified: Secondary | ICD-10-CM | POA: Diagnosis not present

## 2015-02-26 DIAGNOSIS — Z6833 Body mass index (BMI) 33.0-33.9, adult: Secondary | ICD-10-CM | POA: Diagnosis not present

## 2015-02-26 DIAGNOSIS — E119 Type 2 diabetes mellitus without complications: Secondary | ICD-10-CM | POA: Diagnosis not present

## 2015-02-26 DIAGNOSIS — G43111 Migraine with aura, intractable, with status migrainosus: Secondary | ICD-10-CM | POA: Diagnosis not present

## 2015-02-26 DIAGNOSIS — G44011 Episodic cluster headache, intractable: Secondary | ICD-10-CM | POA: Diagnosis not present

## 2015-02-26 MED FILL — TRULICITY 0.75 MG/0.5 ML PE: 0.75 | 28 days supply | Qty: 2 | Fill #0

## 2015-02-26 MED FILL — HYDROCODON-APAP 5-325: 5-325 | 8 days supply | Qty: 30 | Fill #0

## 2015-02-26 MED FILL — RIZATRIPTAN 10 MG TABLET: 10 | 30 days supply | Qty: 9 | Fill #0

## 2015-02-26 MED FILL — CONTOUR NEXT STRIPS: 50 days supply | Qty: 100 | Fill #0

## 2015-02-26 MED FILL — CONTOUR NEXT METER: W/DEVICE | 30 days supply | Qty: 1 | Fill #0

## 2015-03-04 ENCOUNTER — Encounter: Payer: Self-pay | Admitting: *Deleted

## 2015-03-04 DIAGNOSIS — E119 Type 2 diabetes mellitus without complications: Secondary | ICD-10-CM

## 2015-03-04 NOTE — Patient Outreach (Signed)
Letter sent to [facility] requesting she call and make a Link To Wellness follow up appointment as she did not respond to the e-mail sent to her personal e-mail address on 02/24/15. Barrington Ellison RN,CCM,CDE [place] Management Coordinator Link To Wellness Office Phone [phone] Office Fax [phone]

## 2015-03-06 ENCOUNTER — Telehealth: Payer: Self-pay

## 2015-03-16 DIAGNOSIS — E119 Type 2 diabetes mellitus without complications: Secondary | ICD-10-CM | POA: Diagnosis not present

## 2015-03-24 ENCOUNTER — Other Ambulatory Visit: Payer: Self-pay | Admitting: *Deleted

## 2015-03-24 VITALS — BP 110/78 | Ht 70.0 in | Wt 227.8 lb

## 2015-03-24 DIAGNOSIS — E119 Type 2 diabetes mellitus without complications: Secondary | ICD-10-CM

## 2015-03-25 DIAGNOSIS — H524 Presbyopia: Secondary | ICD-10-CM | POA: Diagnosis not present

## 2015-03-25 DIAGNOSIS — H401212 Low-tension glaucoma, right eye, moderate stage: Secondary | ICD-10-CM | POA: Diagnosis not present

## 2015-03-25 NOTE — Patient Outreach (Addendum)
Becky Simmons) Care Management   03/24/15  Becky Simmons [birth year] [id number]  Becky Simmons is an 44 y.o. female who presents to the [place] Management office for routine Link To Wellness follow up for self management assistance with Type II DM, and hyperlipidemia.  Subjective:  Becky Simmons states she is doing much better as she is no longer in school and will start her new position in the [employer] department next week. She says she will be in orientation for a few weeks then go back to 3rd shift which she enjoys. Becky Simmons says she saw her primary care MD three weeks ago and was started on Trulicity. She says her fasting blood sugars are now consistently meeting target. She is requesting a sharps disposal container. She says she attended Zumba class at [facility] with her daughter twice and she enjoyed it.  Objective:   Review of Systems  Constitutional: Negative.     Physical Exam  Constitutional: She is oriented to person, place, and time. She appears well-developed and well-nourished.  Respiratory: Effort normal.  Neurological: She is alert and oriented to person, place, and time.  Skin: Skin is warm and dry.  Psychiatric: She has a normal mood and affect. Her behavior is normal. Judgment and thought content normal.   Filed Vitals:   03/24/15 0853  BP: 110/78   Filed Weights   03/24/15 0853  Weight: 227 lb 12.8 oz (103.329 kg)   Current Medications:   Current Outpatient Prescriptions  Medication Sig Dispense Refill  . Dulaglutide (TRULICITY) 0.01 VC/9.4WH SOPN Inject 0.75 mg into the skin once a week.    . sitaGLIPtin (JANUVIA) 100 MG tablet Take 100 mg by mouth daily.    Marland Kitchen HYDROcodone-acetaminophen (HYCET) 7.5-325 mg/15 ml solution Take 10 mLs by mouth every 6 (six) hours as needed (for pain). (Patient not taking: Reported on 01/19/2015) 120 mL 0   No current facility-administered medications for this visit.     Functional Status:   In your present state of health, do you have any difficulty performing the following activities: 03/24/2015 01/19/2015  Hearing? N N  Vision? N N  Difficulty concentrating or making decisions? N N  Walking or climbing stairs? N N  Dressing or bathing? N N  Doing errands, shopping? N N    Fall/Depression Screening:    PHQ 2/9 Scores 01/19/2015  PHQ - 2 Score 0    Assessment:   Becky Simmons employee with Type II DM, now on Trulicity meeting targets for Hgb A1C and fasting blood sugars. Elevated total cholesterol and LDL , does not want to start a statin.   Plan:  [facility] CM Care Plan Problem One        Most Recent Value   Care Plan Problem One  Type II DM now taking Januvia and Trulicity with all fasting blood sugars meeting target of <130, most recent Hgb A1C= 6.7% on 11/08/14   Role Documenting the Problem One  Care Management [place] for Problem One  Active   THN Long Term Goal (31-90 days)  Ongoing good control of Type II DM as evidenced by fasting blood sugars meeting target and Hgb A1C remains <7.0% and improved lipid profile at next assessment   THN Long Term Goal Start Date  03/24/15   [facility] Long Term Goal Met Date     Interventions for Problem One Long Term Goal  reviewed self management skills of activity, CBG  monitoring,and medication adherence, discussed mechanism of action or Trulicity, dosage and common side effects, reviewed meter history and reviewed targets, reviewed lipid profile lab results and provided handout on how to lower cholesterol and LDL and increase HDL, reviewed upcoming appointments with primary care MD and requested she e-mail me the results of her labs, will arrange for Link To Wellness follow up in 6 months if her Hgb A1C remains under 7%       RNCM to fax today's office visit note to Dr. Rozetta Nunnery. RNCM will meet twice yearly and as needed with patient per Link To Wellness program guidelines to assist with Type II DM and  hyperlipidemia self-management and assess patient's progress toward mutually set goals. Barrington Ellison RN,CCM,CDE [place] Management Coordinator Link To Wellness Office Phone [phone] Office Fax [phone]

## 2015-03-26 DIAGNOSIS — G44011 Episodic cluster headache, intractable: Secondary | ICD-10-CM | POA: Diagnosis not present

## 2015-03-26 DIAGNOSIS — E782 Mixed hyperlipidemia: Secondary | ICD-10-CM | POA: Diagnosis not present

## 2015-03-26 DIAGNOSIS — E119 Type 2 diabetes mellitus without complications: Secondary | ICD-10-CM | POA: Diagnosis not present

## 2015-03-26 DIAGNOSIS — E669 Obesity, unspecified: Secondary | ICD-10-CM | POA: Diagnosis not present

## 2015-03-27 ENCOUNTER — Other Ambulatory Visit: Payer: Self-pay | Admitting: *Deleted

## 2015-03-27 NOTE — Patient Outreach (Signed)
Secure e-mail sent to [name]  e-mail address reminding her to e-mail the results of her labs, especially her Hgb A1C when she gets them so the timing of her Link To Wellness follow up can be determined. Barrington Ellison RN,CCM,CDE [place] Management Coordinator Link To Wellness Office Phone [phone] Office Fax [phone]

## 2015-04-01 DIAGNOSIS — H66001 Acute suppurative otitis media without spontaneous rupture of ear drum, right ear: Secondary | ICD-10-CM | POA: Diagnosis not present

## 2015-04-01 MED FILL — FLUCONAZOLE 150 MG TABLET: 150 | 1 days supply | Qty: 1 | Fill #0

## 2015-04-01 MED FILL — AMOXICILLIN 500 MG CAPSULE: 500 | 7 days supply | Qty: 14 | Fill #0

## 2015-04-17 MED FILL — CONTOUR NEXT STRIPS: 50 days supply | Qty: 100 | Fill #1

## 2015-04-22 MED FILL — JANUVIA 100 MG TABLET: 100 | 90 days supply | Qty: 90 | Fill #1

## 2015-05-06 ENCOUNTER — Telehealth: Payer: 59 | Admitting: Nurse Practitioner

## 2015-05-06 DIAGNOSIS — J069 Acute upper respiratory infection, unspecified: Secondary | ICD-10-CM

## 2015-05-06 NOTE — Progress Notes (Signed)

## 2015-05-08 DIAGNOSIS — E119 Type 2 diabetes mellitus without complications: Secondary | ICD-10-CM | POA: Diagnosis not present

## 2015-05-08 DIAGNOSIS — J0100 Acute maxillary sinusitis, unspecified: Secondary | ICD-10-CM | POA: Diagnosis not present

## 2015-05-08 DIAGNOSIS — R05 Cough: Secondary | ICD-10-CM | POA: Diagnosis not present

## 2015-05-08 MED FILL — TRULICITY 0.75 MG/0.5 ML PE: 0.75 | 28 days supply | Qty: 2 | Fill #1

## 2015-05-08 MED FILL — AMOX-CLAV 875-125 MG TABLET: 875-125 | 10 days supply | Qty: 20 | Fill #0

## 2015-05-08 MED FILL — PROMETHAZINE-DM SYRUP: 6.25-15 | 16 days supply | Qty: 240 | Fill #0

## 2015-05-08 MED FILL — FLUCONAZOLE 150 MG TABLET: 150 | 1 days supply | Qty: 1 | Fill #0

## 2015-05-08 MED FILL — BENZONATATE 100 MG CAPSULE: 100 | 6 days supply | Qty: 20 | Fill #0

## 2015-05-20 ENCOUNTER — Other Ambulatory Visit: Payer: Self-pay

## 2015-05-20 DIAGNOSIS — Z1231 Encounter for screening mammogram for malignant neoplasm of breast: Secondary | ICD-10-CM

## 2015-05-25 ENCOUNTER — Other Ambulatory Visit: Payer: Self-pay | Admitting: *Deleted

## 2015-05-25 DIAGNOSIS — E119 Type 2 diabetes mellitus without complications: Secondary | ICD-10-CM

## 2015-05-25 NOTE — Patient Outreach (Addendum)
Second e-mail (first one was sent on 03/27/15 and no reply was received) sent to [facility] requesting most recent Hgb A1C result so that timing of next Link To Wellness appointment can be determined. Received return email from [facility] with the results of her Hgb A1C = 7.2% done 03/16/15.  Advised [name] this RNCM will contact her in July about Link To Wellness follow up.  Barrington Ellison RN,CCM,CDE [place] Management Coordinator Link To Wellness Office Phone [phone] Office Fax [phone]

## 2015-05-27 DIAGNOSIS — H40002 Preglaucoma, unspecified, left eye: Secondary | ICD-10-CM | POA: Diagnosis not present

## 2015-05-27 DIAGNOSIS — H401112 Primary open-angle glaucoma, right eye, moderate stage: Secondary | ICD-10-CM | POA: Diagnosis not present

## 2015-05-27 DIAGNOSIS — E119 Type 2 diabetes mellitus without complications: Secondary | ICD-10-CM | POA: Diagnosis not present

## 2015-05-28 DIAGNOSIS — T7840XA Allergy, unspecified, initial encounter: Secondary | ICD-10-CM | POA: Diagnosis not present

## 2015-05-28 DIAGNOSIS — E119 Type 2 diabetes mellitus without complications: Secondary | ICD-10-CM | POA: Diagnosis not present

## 2015-05-29 MED FILL — TIMOLOL 0.5% EYE DROPS: 0.5 | 90 days supply | Qty: 10 | Fill #0

## 2015-06-08 MED FILL — TRULICITY 0.75 MG/0.5 ML PE: 0.75 | 28 days supply | Qty: 2 | Fill #2

## 2015-06-09 ENCOUNTER — Ambulatory Visit
Admission: RE | Admit: 2015-06-09 | Discharge: 2015-06-09 | Disposition: A | Discharge status: Home / Self Care | Payer: 59 | Source: Ambulatory Visit

## 2015-06-09 DIAGNOSIS — Z1231 Encounter for screening mammogram for malignant neoplasm of breast: Secondary | ICD-10-CM | POA: Diagnosis not present

## 2015-07-06 MED FILL — TRULICITY 0.75 MG/0.5 ML PE: 0.75 | 28 days supply | Qty: 2 | Fill #0

## 2015-07-08 DIAGNOSIS — H401112 Primary open-angle glaucoma, right eye, moderate stage: Secondary | ICD-10-CM | POA: Diagnosis not present

## 2015-07-08 DIAGNOSIS — H40002 Preglaucoma, unspecified, left eye: Secondary | ICD-10-CM | POA: Diagnosis not present

## 2015-08-11 MED FILL — TRULICITY 0.75 MG/0.5 ML PE: 0.75 | 28 days supply | Qty: 2 | Fill #1

## 2015-08-11 MED FILL — JANUVIA 100 MG TABLET: 100 | 30 days supply | Qty: 30 | Fill #0

## 2015-08-21 DIAGNOSIS — R21 Rash and other nonspecific skin eruption: Secondary | ICD-10-CM | POA: Diagnosis not present

## 2015-08-21 DIAGNOSIS — E119 Type 2 diabetes mellitus without complications: Secondary | ICD-10-CM | POA: Diagnosis not present

## 2015-08-31 ENCOUNTER — Encounter: Payer: Self-pay | Admitting: *Deleted

## 2015-08-31 DIAGNOSIS — E119 Type 2 diabetes mellitus without complications: Secondary | ICD-10-CM

## 2015-08-31 NOTE — Patient Outreach (Signed)
Becky Simmons is due link To Wellness follow up in July; she was last seen in January 2017. She did not respond to an e-mail sent to her [facility] e-mail address on 08/24/15 requesting that she schedule a follow up. A letter will be mailed today requesting she call and schedule an appointment or risk termination from the program. Barrington Ellison RN,CCM,CDE [place] Management Coordinator Link To Wellness Office Phone [phone] Office Fax [phone]

## 2015-09-15 ENCOUNTER — Other Ambulatory Visit: Payer: Self-pay | Admitting: *Deleted

## 2015-09-15 DIAGNOSIS — E119 Type 2 diabetes mellitus without complications: Secondary | ICD-10-CM

## 2015-09-15 NOTE — Patient Outreach (Signed)
[place] [facility]) Care Management   09/15/2015  LONITA DEBES 1971-02-01 [id number]  Becky Simmons is an 45 y.o. female who presents to the [place] Management office for routine Link To Wellness follow up for self management assistance with Type II DM, hyperlipidemia and obesity.  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 She has resumed the medication and uses over the counter medications to treat the itching and redness which is worse on the day she gives the weekly injection.  She has her Hgb A1C checked in January and it was 7.2%, up from 6.7%. She does not know what caused the increase. She says she will make a follow up appointment with Dr. Rozetta Nunnery because she is overdue. She says she pays a $25 monthly copay for the Trulicity and accepted an offer from this RNCM to check with the OP pharmacy to see if the copay be reduced with a coupon. She says her adult daughter, who lives with her, was recently diagnosed with Type II DM and her lifestyle modifications have inspired Becky Simmons to make changes also.  Objective:   Review of Systems  Constitutional: Negative.     Physical Exam  Constitutional: She is oriented to person, place, and time. She appears well-developed and well-nourished.  Respiratory: Effort normal.  Neurological: She is alert and oriented to person, place, and time.  Skin: Skin is warm and dry.  Psychiatric: She has a normal mood and affect. Her behavior is normal. Judgment and thought content normal.   Filed Weights   09/15/15 1352  Weight: 238 lb (108 kg)   Vitals:   09/15/15 1352  BP: 100/72   Encounter Medications:   Outpatient Encounter Prescriptions as of 09/15/2015  Medication Sig Note  . Dulaglutide  (TRULICITY) 0.81 KG/8.1EH SOPN Inject 0.75 mg into the skin once a week. 03/24/2015: Injects every Monday  . HYDROcodone-acetaminophen (HYCET) 7.5-325 mg/15 ml solution Take 10 mLs by mouth every 6 (six) hours as needed (for pain). (Patient not taking: Reported on 01/19/2015)   . sitaGLIPtin (JANUVIA) 100 MG tablet Take 100 mg by mouth daily.    No facility-administered encounter medications on file as of 09/15/2015.     Functional Status:   In your present state of health, do you have any difficulty performing the following activities: 03/24/2015 01/19/2015  Hearing? N N  Vision? N N  Difficulty concentrating or making decisions? N N  Walking or climbing stairs? N N  Dressing or bathing? N N  Doing errands, shopping? N N  Some recent data might be hidden    Fall/Depression Screening:    PHQ 2/9 Scores 01/19/2015  PHQ - 2 Score 0    Assessment:   [employer] employee and Link To Wellness member with Type II DM and hyperlipidemia. Not meeting treatment targets at present. Hgb A1C in January 2017 was 7.2% and total cholesterol, and LDL were elevated on lipid profile done in January.   Plan:  [facility] CM Care Plan Problem One   Flowsheet Row Most Recent Value  Care Plan Problem One Type II DM on Januvia and Trulicity with increase in Hgb A1C from 6.7% to 7.2%, hyperlipidemia on no medications with elevated cholesterol and LDL, obesity with current BMI= 34.15  Role Documenting the Problem One  Care Management [place] for Problem One  Active  THN Long Term Goal (31-90 days) Improved control of Type II DM as evidenced by 75% of fasting blood sugars meeting target and Hgb A1C <7.0% at next assessment, improved lipid profile at next assessment, no weight gain or weight loss at next assessment  THN Long Term Goal Start Date  09/15/15  [facility] Long Term Goal Met Date    Interventions for Problem One Long Term Goal reviewed self management skills of activity, CBG monitoring,and medication  adherence, reviewed mechanism of action of Trulicity, dosage and common side effects, will contact Luetta Nutting pharmacist  at [place] to see if Trulicity can be reduced with a coupon, reviewed meter history and reviewed targets, reviewed lipid profile lab results and referred Becky Simmons to the handout provided at last visit for strategies to lower cholesterol and LDL and increase HDL, encouraged Becky Simmons to make an appointment with her primary care MD since she is overdue, encouraged Becky Simmons to make yearly eye appointment,  will arrange for Link To Wellness follow up in 3 months after she sees her primary care provider     Will fax today's note to Dr. Rozetta Nunnery. Will plan to meet with Becky Simmons every 3 months for self management assistance with Type II DM and hyperlipidemia.  Barrington Ellison RN,CCM,CDE [place] Management Coordinator Link To Wellness Office Phone [phone] Office Fax [phone]

## 2015-09-29 MED FILL — TRULICITY 0.75 MG/0.5 ML PE: 0.75 | 28 days supply | Qty: 2 | Fill #2

## 2015-09-29 MED FILL — RIZATRIPTAN 10 MG TABLET: 10 | 30 days supply | Qty: 9 | Fill #1

## 2015-09-29 MED FILL — JANUVIA 100 MG TABLET: 100 | 30 days supply | Qty: 30 | Fill #1

## 2015-11-09 MED FILL — JANUVIA 100 MG TABLET: 100 | 30 days supply | Qty: 30 | Fill #2

## 2015-11-09 MED FILL — TRULICITY 0.75 MG/0.5 ML PE: 0.75 | 28 days supply | Qty: 2 | Fill #3

## 2015-11-16 DIAGNOSIS — E119 Type 2 diabetes mellitus without complications: Secondary | ICD-10-CM | POA: Diagnosis not present

## 2015-11-16 DIAGNOSIS — E782 Mixed hyperlipidemia: Secondary | ICD-10-CM | POA: Diagnosis not present

## 2015-11-18 DIAGNOSIS — G8929 Other chronic pain: Secondary | ICD-10-CM | POA: Diagnosis not present

## 2015-11-18 DIAGNOSIS — E669 Obesity, unspecified: Secondary | ICD-10-CM | POA: Diagnosis not present

## 2015-11-18 DIAGNOSIS — M5441 Lumbago with sciatica, right side: Secondary | ICD-10-CM | POA: Diagnosis not present

## 2015-11-18 DIAGNOSIS — E782 Mixed hyperlipidemia: Secondary | ICD-10-CM | POA: Diagnosis not present

## 2015-11-18 DIAGNOSIS — E1165 Type 2 diabetes mellitus with hyperglycemia: Secondary | ICD-10-CM | POA: Diagnosis not present

## 2015-12-10 MED FILL — TRULICITY 1.5 MG/0.5 ML PEN: 1.5 | 28 days supply | Qty: 2 | Fill #0

## 2015-12-10 MED FILL — JANUVIA 100 MG TABLET: 100 | 30 days supply | Qty: 30 | Fill #3

## 2015-12-14 DIAGNOSIS — R350 Frequency of micturition: Secondary | ICD-10-CM | POA: Diagnosis not present

## 2015-12-16 MED FILL — OXYBUTYNIN CL ER 10 MG TAB: 10 | 30 days supply | Qty: 30 | Fill #0

## 2016-01-08 DIAGNOSIS — L918 Other hypertrophic disorders of the skin: Secondary | ICD-10-CM | POA: Diagnosis not present

## 2016-01-08 DIAGNOSIS — R5383 Other fatigue: Secondary | ICD-10-CM | POA: Diagnosis not present

## 2016-01-08 DIAGNOSIS — R030 Elevated blood-pressure reading, without diagnosis of hypertension: Secondary | ICD-10-CM | POA: Diagnosis not present

## 2016-01-08 MED FILL — CARVEDILOL 6.25 MG TABLET: 6.25 | 30 days supply | Qty: 60 | Fill #0

## 2016-01-28 ENCOUNTER — Encounter (HOSPITAL_BASED_OUTPATIENT_CLINIC_OR_DEPARTMENT_OTHER): Payer: Self-pay

## 2016-01-28 ENCOUNTER — Emergency Department (HOSPITAL_BASED_OUTPATIENT_CLINIC_OR_DEPARTMENT_OTHER)
Admission: EM | Admit: 2016-01-28 | Discharge: 2016-01-29 | Disposition: A | Discharge status: Home / Self Care | Payer: 59 | Attending: Emergency Medicine | Admitting: Emergency Medicine

## 2016-01-28 DIAGNOSIS — Z7984 Long term (current) use of oral hypoglycemic drugs: Secondary | ICD-10-CM | POA: Diagnosis not present

## 2016-01-28 DIAGNOSIS — E119 Type 2 diabetes mellitus without complications: Secondary | ICD-10-CM | POA: Diagnosis not present

## 2016-01-28 DIAGNOSIS — M62838 Other muscle spasm: Secondary | ICD-10-CM | POA: Diagnosis not present

## 2016-01-28 DIAGNOSIS — I10 Essential (primary) hypertension: Secondary | ICD-10-CM | POA: Insufficient documentation

## 2016-01-28 DIAGNOSIS — Z79899 Other long term (current) drug therapy: Secondary | ICD-10-CM | POA: Insufficient documentation

## 2016-01-28 DIAGNOSIS — M542 Cervicalgia: Secondary | ICD-10-CM | POA: Diagnosis not present

## 2016-01-28 HISTORY — DX: Migraine, unspecified, not intractable, without status migrainosus: G43.909

## 2016-01-28 HISTORY — DX: Essential (primary) hypertension: I10

## 2016-01-28 NOTE — ED Notes (Signed)
C/o neck pain and stiffness x 3 days  Getting worse,  Denies inj

## 2016-01-28 NOTE — ED Provider Notes (Signed)
[facility] DEPT [facility] Provider Note   CSN: [id number] Arrival date & time: 01/28/16  2130  By signing my name below, I, Becky Simmons, attest that this documentation has been prepared under the direction and in the presence of [employer], PA-C. Electronically Signed: Judithann Sauger, ED Scribe. 01/28/16. 12:00 AM.   History   Chief Complaint Chief Complaint  Patient presents with  . Neck Pain    HPI Comments: Becky Simmons is a 45 y.o. female with a hx of hypertension, migraines, and DM II who presents to the Emergency Department complaining of gradually worsening moderate posterior neck pain (worse on the left) onset 3 days ago. She reports associated limited ROM due to pain. She adds that she has been experiencing a persistent headache consistent with her hx of migraines all day today, Which is improved now. 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 No alleviating factors noted. She states that she called her PCP today who advised her to take Tylenol instead of ibuprofen since she recently started a new HTN medication. She adds that she also tried warm compresses and tiger balm with no relief. Pt has an allergy to Invokana and Metformin. She denies any fever, chills, numbness/tingling in BLE, abdominal pain, nausea, vomiting, chest pain, shortness of breath, urinary symptoms, or any other symptoms.   The history is provided by the patient. No language interpreter was used.    Past Medical History:  Diagnosis Date  . Hypertension   . Migraine   . Type II or unspecified type diabetes mellitus without mention of complication, not stated as uncontrolled     Patient Active Problem List   Diagnosis Date Noted  . Diabetes ([place]) 01/19/2015  . Need for prophylactic vaccination against Streptococcus pneumoniae (pneumococcus) 07/27/2012  . Type II or unspecified type diabetes mellitus without mention of  complication, uncontrolled 07/27/2012    Past Surgical History:  Procedure Laterality Date  . BUNIONECTOMY Bilateral   . CESAREAN SECTION  2012  . MYOMECTOMY    . TUBAL LIGATION    . TUBAL LIGATION  02/2010    OB History    Gravida Para Term Preterm AB Living   1             SAB TAB Ectopic Multiple Live Births                   Home Medications    Prior to Admission medications   Medication Sig Start Date End Date Taking? Authorizing Provider  UNKNOWN TO PATIENT BP med   Yes Historical Provider, MD  cyclobenzaprine (FLEXERIL) 10 MG tablet Take 1 tablet (10 mg total) by mouth 2 (two) times daily as needed for muscle spasms. 01/29/16   Kassondra Geil M Harrietta Incorvaia, PA-C  Dulaglutide (TRULICITY) [id number] [id number] SOPN Inject 0.75 mg into the skin once a week.    Historical Provider, MD  hydrocortisone cream 1 % Apply 1 application topically as needed for itching (for Trulicity injection site itching).    Historical Provider, MD  naproxen (NAPROSYN) 500 MG tablet Take 1 tablet (500 mg total) by mouth 2 (two) times daily. 01/29/16   Alanta Scobey M Farrell Broerman, PA-C  sitaGLIPtin (JANUVIA) 100 MG tablet Take 100 mg by mouth daily.    Historical Provider, MD  timolol (BETIMOL) 0.5 % ophthalmic solution Place 1 drop into the right eye daily.    Historical Provider, MD  traMADol (ULTRAM) 50 MG tablet Take 1  tablet (50 mg total) by mouth every 6 (six) hours as needed for severe pain. 01/29/16   Frederica Kuster, PA-C    Family History Family History  Problem Relation Age of Onset  . Diabetes Mother   . Hypertension Father   . Cancer Father     Prostate Cancer  . Diabetes Maternal Grandmother   . Diabetes Paternal Grandmother     Social History Social History  Substance Use Topics  . Smoking status: Never Smoker  . Smokeless tobacco: Never Used  . Alcohol use Yes     Comment: Occasional     Allergies   Invokana [canagliflozin] and Metformin and related   Review of Systems Review of Systems    Constitutional: Negative for chills and fever.  HENT: Negative for facial swelling and sore throat.   Respiratory: Negative for shortness of breath.   Cardiovascular: Negative for chest pain.  Gastrointestinal: Negative for abdominal pain, nausea and vomiting.  Genitourinary: Negative for dysuria.  Musculoskeletal: Positive for neck pain. Negative for back pain.  Skin: Negative for rash and wound.  Neurological: Positive for headaches.  Psychiatric/Behavioral: The patient is not nervous/anxious.      Physical Exam Updated Vital Signs BP 137/90 (BP Location: Left Arm)   Pulse 83   Temp 98.2 F (36.8 C) (Oral)   Resp 18   LMP 01/17/2016   SpO2 99%   Physical Exam  Constitutional: She appears well-developed and well-nourished. No distress.  HENT:  Head: Normocephalic and atraumatic.  Mouth/Throat: Oropharynx is clear and moist. No oropharyngeal exudate.  Eyes: Conjunctivae and EOM are normal. Pupils are equal, round, and reactive to light. Right eye exhibits no discharge. Left eye exhibits no discharge. No scleral icterus.  Neck: Neck supple. Spinous process tenderness and muscular tenderness present. Decreased range of motion present. No thyromegaly present.    Cardiovascular: Normal rate, regular rhythm and normal heart sounds.  Exam reveals no gallop and no friction rub.   No murmur heard. Pulmonary/Chest: Effort normal and breath sounds normal. No stridor. No respiratory distress. She has no wheezes. She has no rales.  Abdominal: Soft. Bowel sounds are normal. She exhibits no distension. There is no tenderness. There is no rebound and no guarding.  Musculoskeletal: She exhibits no edema.       Cervical back: She exhibits tenderness, bony tenderness and spasm.  Lymphadenopathy:    She has no cervical adenopathy.  Neurological: She is alert. Coordination normal.  CN 3-12 intact; normal sensation throughout; 5/5 strength in all 4 extremities; equal bilateral grip strength   Skin: Skin is warm and dry. No rash noted. She is not diaphoretic. No pallor.  Psychiatric: She has a normal mood and affect.  Nursing note and vitals reviewed.    ED Treatments / Results  DIAGNOSTIC STUDIES: Oxygen Saturation is 99% on RA, normal by my interpretation.    COORDINATION OF CARE: 12:00 AM- Pt advised of plan for treatment and pt agrees. Pt will receive muscle relaxer and recommended to use heat. Explained that pt's presentation does not warrant any imaging. Advised to follow up with PCP.    Labs (all labs ordered are listed, but only abnormal results are displayed) Labs Reviewed - No data to display  EKG  EKG Interpretation None       Radiology Ct Cervical Spine Wo Contrast  Result Date: 01/29/2016 CLINICAL DATA:  Posterior and lateral neck pain for 3 days, no injury. History of diabetes, hypertension and migraines. EXAM: CT CERVICAL SPINE WITHOUT  CONTRAST TECHNIQUE: Multidetector CT imaging of the cervical spine was performed without intravenous contrast. Multiplanar CT image reconstructions were also generated. COMPARISON:  Neck radiograph May 29, 2013 FINDINGS: ALIGNMENT: Straightened lordosis. Vertebral bodies in alignment. SKULL BASE AND VERTEBRAE: Cervical vertebral bodies and posterior elements are intact. Moderate C5-6, moderate to severe C6-7 disc height loss, mild at C7-T1 with uncovertebral hypertrophy and endplate spurring compatible with degenerative discs. C1-2 articulation maintained with mild longus coli insertional enthesopathy. No destructive bony lesions. SOFT TISSUES AND SPINAL CANAL: Nonacute. Nuchal ligament calcifications. DISC LEVELS: Mild canal stenosis C6-7. Moderate RIGHT C6-7 neural foraminal narrowing. UPPER CHEST: Lung apices are clear. OTHER: None. IMPRESSION: Mild canal stenosis C6-7 and moderate RIGHT C6-7 neural foraminal narrowing. Electronically Signed   By: Elon Alas M.D.   On: 01/29/2016 00:56    Procedures Procedures  (including critical care time)  Medications Ordered in ED Medications - No data to display   Initial Impression / Assessment and Plan / ED Course  Eliezer Mccoy, PA-C has reviewed the triage vital signs and the nursing notes.  Pertinent labs & imaging results that were available during my care of the patient were reviewed by me and considered in my medical decision making (see chart for details).  Clinical Course     Patient with most likely muscle spasms of her neck. CT C-spine shows mild canal stenosis C6-7 and moderate right C6-7 neural foraminal narrowing. Patient unable to take NSAIDs medication according to her doctor, so I'll discharge patient home with short course of tramadol and patient is to call her doctor to see if she is is able to take Naprosyn. Also sent home with Flexeril. Supportive treatment discussed including moist heat, stretching, massage. Patient to follow-up with her PCP next week for further evaluation and treatment. Return precautions discussed. Patient understands and agrees with plan. Patient vitals stable throughout ED course discharged in satisfactory condition.  Final Clinical Impressions(s) / ED Diagnoses   Final diagnoses:  Muscle spasms of neck    New Prescriptions New Prescriptions   CYCLOBENZAPRINE (FLEXERIL) 10 MG TABLET    Take 1 tablet (10 mg total) by mouth 2 (two) times daily as needed for muscle spasms.   NAPROXEN (NAPROSYN) 500 MG TABLET    Take 1 tablet (500 mg total) by mouth 2 (two) times daily.   TRAMADOL (ULTRAM) 50 MG TABLET    Take 1 tablet (50 mg total) by mouth every 6 (six) hours as needed for severe pain.   I personally performed the services described in this documentation, which was scribed in my presence. The recorded information has been reviewed and is accurate.    Frederica Kuster, PA-C 01/29/16 [place], DO 01/29/16 [id number]

## 2016-01-28 NOTE — ED Triage Notes (Signed)
C/o pain to "the sides and the back" of neck x 3 days-denies injury-NAD-steady gait

## 2016-01-29 ENCOUNTER — Emergency Department (HOSPITAL_BASED_OUTPATIENT_CLINIC_OR_DEPARTMENT_OTHER): Payer: 59

## 2016-01-29 DIAGNOSIS — M542 Cervicalgia: Secondary | ICD-10-CM | POA: Diagnosis not present

## 2016-01-29 DIAGNOSIS — M62838 Other muscle spasm: Secondary | ICD-10-CM | POA: Diagnosis not present

## 2016-01-29 DIAGNOSIS — I10 Essential (primary) hypertension: Secondary | ICD-10-CM | POA: Diagnosis not present

## 2016-01-29 DIAGNOSIS — Z7984 Long term (current) use of oral hypoglycemic drugs: Secondary | ICD-10-CM | POA: Diagnosis not present

## 2016-01-29 DIAGNOSIS — Z79899 Other long term (current) drug therapy: Secondary | ICD-10-CM | POA: Diagnosis not present

## 2016-01-29 DIAGNOSIS — E119 Type 2 diabetes mellitus without complications: Secondary | ICD-10-CM | POA: Diagnosis not present

## 2016-01-29 MED ORDER — CYCLOBENZAPRINE HCL 10 MG PO TABS: 10.0000 mg | ORAL_TABLET | Freq: Two times a day (BID) | ORAL | 0 refills | Status: DC | PRN

## 2016-01-29 MED ORDER — TRAMADOL HCL 50 MG PO TABS: 50.0000 mg | ORAL_TABLET | Freq: Four times a day (QID) | ORAL | 0 refills | Status: DC | PRN

## 2016-01-29 MED ORDER — NAPROXEN 500 MG PO TABS: 500.0000 mg | ORAL_TABLET | Freq: Two times a day (BID) | ORAL | 0 refills | Status: DC

## 2016-01-29 NOTE — Discharge Instructions (Signed)
Medications: Naprosyn, Flexeril, tramadol  Treatment: If your primary care provider advises you that it is okay to take Naprosyn, take twice daily for your pain. Take tramadol every 6 hours as needed for severe pain. Take Flexeril twice daily as needed for muscle pain and spasms. Do not drive or operate machinery when taking Flexeril or Tramadol. Use moist heat 3-4 times daily alternating 20 minutes on, 20 minutes off. Attempt the stretches that we discussed a few times a day for 30 seconds. I recommend that you get a massage.  Follow-up: Please follow-up with your primary care provider tomorrow or Monday for follow-up and further evaluation and treatment. Please return to emergency department if you develop any new or worsening symptoms.

## 2016-02-02 DIAGNOSIS — L918 Other hypertrophic disorders of the skin: Secondary | ICD-10-CM | POA: Diagnosis not present

## 2016-02-05 MED FILL — TRULICITY 1.5 MG/0.5 ML PEN: 1.5 | 28 days supply | Qty: 2 | Fill #1

## 2016-02-05 MED FILL — JANUVIA 100 MG TABLET: 100 | 30 days supply | Qty: 30 | Fill #4

## 2016-02-12 MED FILL — CARVEDILOL 6.25 MG TABLET: 6.25 | 30 days supply | Qty: 60 | Fill #1

## 2016-02-23 DIAGNOSIS — Z0000 Encounter for general adult medical examination without abnormal findings: Secondary | ICD-10-CM | POA: Diagnosis not present

## 2016-02-23 DIAGNOSIS — E1165 Type 2 diabetes mellitus with hyperglycemia: Secondary | ICD-10-CM | POA: Diagnosis not present

## 2016-02-25 DIAGNOSIS — M5416 Radiculopathy, lumbar region: Secondary | ICD-10-CM | POA: Diagnosis not present

## 2016-02-25 DIAGNOSIS — M4802 Spinal stenosis, cervical region: Secondary | ICD-10-CM | POA: Diagnosis not present

## 2016-02-25 DIAGNOSIS — Z0000 Encounter for general adult medical examination without abnormal findings: Secondary | ICD-10-CM | POA: Diagnosis not present

## 2016-02-25 DIAGNOSIS — E1165 Type 2 diabetes mellitus with hyperglycemia: Secondary | ICD-10-CM | POA: Diagnosis not present

## 2016-02-25 DIAGNOSIS — Z7984 Long term (current) use of oral hypoglycemic drugs: Secondary | ICD-10-CM | POA: Diagnosis not present

## 2016-02-25 DIAGNOSIS — M519 Unspecified thoracic, thoracolumbar and lumbosacral intervertebral disc disorder: Secondary | ICD-10-CM | POA: Diagnosis not present

## 2016-02-25 DIAGNOSIS — M5412 Radiculopathy, cervical region: Secondary | ICD-10-CM | POA: Diagnosis not present

## 2016-02-25 DIAGNOSIS — E782 Mixed hyperlipidemia: Secondary | ICD-10-CM | POA: Diagnosis not present

## 2016-02-25 MED FILL — PRAVASTATIN NA 40 MG TAB: 40 | 30 days supply | Qty: 30 | Fill #0

## 2016-02-25 MED FILL — CYCLOBENZAPRINE 10 MG TAB: 10 | 20 days supply | Qty: 60 | Fill #0

## 2016-03-04 MED FILL — TRULICITY 1.5 MG/0.5 ML PEN: 1.5 | 28 days supply | Qty: 2 | Fill #2

## 2016-03-04 MED FILL — JANUVIA 100 MG TABLET: 100 | 30 days supply | Qty: 30 | Fill #5

## 2016-03-23 DIAGNOSIS — G4730 Sleep apnea, unspecified: Secondary | ICD-10-CM | POA: Diagnosis not present

## 2016-03-23 DIAGNOSIS — G43009 Migraine without aura, not intractable, without status migrainosus: Secondary | ICD-10-CM | POA: Diagnosis not present

## 2016-03-28 MED FILL — CARVEDILOL 6.25 MG TABLET: 6.25 | 30 days supply | Qty: 60 | Fill #2

## 2016-03-29 DIAGNOSIS — M5412 Radiculopathy, cervical region: Secondary | ICD-10-CM | POA: Diagnosis not present

## 2016-03-29 DIAGNOSIS — M5416 Radiculopathy, lumbar region: Secondary | ICD-10-CM | POA: Diagnosis not present

## 2016-03-29 DIAGNOSIS — M519 Unspecified thoracic, thoracolumbar and lumbosacral intervertebral disc disorder: Secondary | ICD-10-CM | POA: Diagnosis not present

## 2016-03-29 DIAGNOSIS — M4802 Spinal stenosis, cervical region: Secondary | ICD-10-CM | POA: Diagnosis not present

## 2016-03-29 MED FILL — TRULICITY 1.5 MG/0.5 ML PEN: 1.5 | 28 days supply | Qty: 2 | Fill #3

## 2016-04-07 DIAGNOSIS — M5416 Radiculopathy, lumbar region: Secondary | ICD-10-CM | POA: Diagnosis not present

## 2016-04-07 DIAGNOSIS — E1165 Type 2 diabetes mellitus with hyperglycemia: Secondary | ICD-10-CM | POA: Diagnosis not present

## 2016-04-07 DIAGNOSIS — E669 Obesity, unspecified: Secondary | ICD-10-CM | POA: Diagnosis not present

## 2016-04-07 DIAGNOSIS — G4730 Sleep apnea, unspecified: Secondary | ICD-10-CM | POA: Diagnosis not present

## 2016-04-07 DIAGNOSIS — M5412 Radiculopathy, cervical region: Secondary | ICD-10-CM | POA: Diagnosis not present

## 2016-04-07 DIAGNOSIS — G4719 Other hypersomnia: Secondary | ICD-10-CM | POA: Diagnosis not present

## 2016-04-07 DIAGNOSIS — E782 Mixed hyperlipidemia: Secondary | ICD-10-CM | POA: Diagnosis not present

## 2016-04-11 DIAGNOSIS — M5416 Radiculopathy, lumbar region: Secondary | ICD-10-CM | POA: Diagnosis not present

## 2016-04-11 MED FILL — PRAVASTATIN NA 40 MG TAB: 40 | 30 days supply | Qty: 30 | Fill #1

## 2016-04-26 DIAGNOSIS — M5416 Radiculopathy, lumbar region: Secondary | ICD-10-CM | POA: Diagnosis not present

## 2016-04-29 MED FILL — CARVEDILOL 6.25 MG TABLET: 6.25 | 30 days supply | Qty: 60 | Fill #3

## 2016-05-03 MED FILL — JANUVIA 100 MG TABLET: 100 | 30 days supply | Qty: 30 | Fill #0

## 2016-05-05 DIAGNOSIS — R0683 Snoring: Secondary | ICD-10-CM | POA: Diagnosis not present

## 2016-05-13 MED FILL — PRAVASTATIN NA 40 MG TAB: 40 | 90 days supply | Qty: 90 | Fill #2

## 2016-05-31 MED FILL — JANUVIA 100 MG TABLET: 100 | 30 days supply | Qty: 30 | Fill #1

## 2016-05-31 MED FILL — CARVEDILOL 6.25 MG TABLET: 6.25 | 30 days supply | Qty: 60 | Fill #4

## 2016-06-13 MED FILL — TRULICITY 1.5 MG/0.5 ML PEN: 1.5 | 28 days supply | Qty: 2 | Fill #4

## 2016-07-01 DIAGNOSIS — E782 Mixed hyperlipidemia: Secondary | ICD-10-CM | POA: Diagnosis not present

## 2016-07-01 DIAGNOSIS — E1165 Type 2 diabetes mellitus with hyperglycemia: Secondary | ICD-10-CM | POA: Diagnosis not present

## 2016-07-04 DIAGNOSIS — G43009 Migraine without aura, not intractable, without status migrainosus: Secondary | ICD-10-CM | POA: Diagnosis not present

## 2016-07-04 DIAGNOSIS — E1165 Type 2 diabetes mellitus with hyperglycemia: Secondary | ICD-10-CM | POA: Diagnosis not present

## 2016-07-04 DIAGNOSIS — E782 Mixed hyperlipidemia: Secondary | ICD-10-CM | POA: Diagnosis not present

## 2016-07-04 DIAGNOSIS — E669 Obesity, unspecified: Secondary | ICD-10-CM | POA: Diagnosis not present

## 2016-07-04 DIAGNOSIS — R5383 Other fatigue: Secondary | ICD-10-CM | POA: Diagnosis not present

## 2016-07-04 MED FILL — CARVEDILOL 6.25 MG TABLET: 6.25 | 90 days supply | Qty: 180 | Fill #5

## 2016-07-04 MED FILL — JANUVIA 100 MG TABLET: 100 | 90 days supply | Qty: 90 | Fill #2

## 2016-07-20 MED FILL — ACCU-CHEK FASTCLIX LANCETS: 90 days supply | Qty: 102 | Fill #0

## 2016-07-20 MED FILL — ACCU-CHEK GUIDE TEST STRIP: 50 days supply | Qty: 100 | Fill #0

## 2016-07-26 MED FILL — TRULICITY 1.5 MG/0.5 ML PEN: 1.5 | 28 days supply | Qty: 2 | Fill #5

## 2016-08-30 MED FILL — PRAVASTATIN NA 40 MG TAB: 40 | 90 days supply | Qty: 90 | Fill #3

## 2016-08-30 MED FILL — TRULICITY 1.5 MG/0.5 ML PEN: 1.5 | 28 days supply | Qty: 2 | Fill #7

## 2016-08-30 MED FILL — TRULICITY 1.5 MG/0.5 ML PEN: 1.5 | 28 days supply | Qty: 2 | Fill #6

## 2016-09-16 MED FILL — ACCU-CHEK GUIDE TEST STRIP: 50 days supply | Qty: 100 | Fill #1

## 2016-09-29 MED FILL — TRULICITY 1.5 MG/0.5 ML PEN: 1.5 | 28 days supply | Qty: 2 | Fill #8

## 2016-10-03 MED FILL — JANUVIA 100 MG TABLET: 100 | 30 days supply | Qty: 30 | Fill #3

## 2016-10-03 MED FILL — CARVEDILOL 6.25 MG TAB: 6.25 | 90 days supply | Qty: 180 | Fill #6

## 2016-10-28 DIAGNOSIS — J069 Acute upper respiratory infection, unspecified: Secondary | ICD-10-CM | POA: Diagnosis not present

## 2016-10-28 MED FILL — TRULICITY 1.5 MG/0.5 ML PEN: 1.5 | 28 days supply | Qty: 2 | Fill #9

## 2016-10-31 MED FILL — JANUVIA 100 MG TABLET: 100 | 90 days supply | Qty: 90 | Fill #0

## 2016-11-04 MED FILL — TIMOLOL 0.5% EYE DROPS: 0.5 | 90 days supply | Qty: 5 | Fill #0

## 2016-11-07 DIAGNOSIS — E782 Mixed hyperlipidemia: Secondary | ICD-10-CM | POA: Diagnosis not present

## 2016-11-07 DIAGNOSIS — E1165 Type 2 diabetes mellitus with hyperglycemia: Secondary | ICD-10-CM | POA: Diagnosis not present

## 2016-11-08 DIAGNOSIS — G43009 Migraine without aura, not intractable, without status migrainosus: Secondary | ICD-10-CM | POA: Diagnosis not present

## 2016-11-08 DIAGNOSIS — E1165 Type 2 diabetes mellitus with hyperglycemia: Secondary | ICD-10-CM | POA: Diagnosis not present

## 2016-11-08 DIAGNOSIS — M5416 Radiculopathy, lumbar region: Secondary | ICD-10-CM | POA: Diagnosis not present

## 2016-11-08 DIAGNOSIS — E782 Mixed hyperlipidemia: Secondary | ICD-10-CM | POA: Diagnosis not present

## 2016-11-08 DIAGNOSIS — R030 Elevated blood-pressure reading, without diagnosis of hypertension: Secondary | ICD-10-CM | POA: Diagnosis not present

## 2016-11-08 DIAGNOSIS — E669 Obesity, unspecified: Secondary | ICD-10-CM | POA: Diagnosis not present

## 2016-11-10 DIAGNOSIS — I10 Essential (primary) hypertension: Secondary | ICD-10-CM | POA: Diagnosis not present

## 2016-11-29 MED FILL — PRAVASTATIN NA 40 MG TAB: 40 | 90 days supply | Qty: 90 | Fill #4

## 2016-11-29 MED FILL — TRULICITY 1.5 MG/0.5 ML PEN: 1.5 | 28 days supply | Qty: 2 | Fill #0

## 2016-12-07 DIAGNOSIS — H524 Presbyopia: Secondary | ICD-10-CM | POA: Diagnosis not present

## 2016-12-07 DIAGNOSIS — E119 Type 2 diabetes mellitus without complications: Secondary | ICD-10-CM | POA: Diagnosis not present

## 2016-12-07 DIAGNOSIS — H401212 Low-tension glaucoma, right eye, moderate stage: Secondary | ICD-10-CM | POA: Diagnosis not present

## 2016-12-13 MED FILL — CARVEDILOL 12.5 MG TABLET: 12.5 | 30 days supply | Qty: 60 | Fill #0

## 2017-01-02 MED FILL — TRULICITY 1.5 MG/0.5 ML PEN: 1.5 | 28 days supply | Qty: 2 | Fill #1

## 2017-01-02 MED FILL — CARVEDILOL 6.25 MG TABLET: 6.25 | 30 days supply | Qty: 60 | Fill #7

## 2017-01-20 ENCOUNTER — Other Ambulatory Visit: Payer: Self-pay | Admitting: *Deleted

## 2017-01-20 NOTE — Patient Outreach (Signed)
Becky Simmons transitioned from the [employer] program to the [employer] on 03/14/16 for Type II diabetes self-management assistance so will close case to the diabetes Link To Wellness program due to delegation of disease management services to [employer] from [employer] for [place] members in 2019. Barrington Ellison RN,CCM,CDE [place] Management Coordinator Link To Wellness and [employer] [phone] Office Fax [phone]

## 2017-01-26 MED FILL — JANUVIA 100 MG TABLET: 100 | 90 days supply | Qty: 90 | Fill #1

## 2017-01-26 MED FILL — TRULICITY 1.5 MG/0.5 ML PEN: 1.5 | 28 days supply | Qty: 2 | Fill #2

## 2017-01-26 MED FILL — CARVEDILOL 12.5 MG TABLET: 12.5 | 30 days supply | Qty: 60 | Fill #1

## 2017-02-20 MED FILL — CARVEDILOL 12.5 MG TABLET: 12.5 | 30 days supply | Qty: 60 | Fill #2

## 2017-02-20 MED FILL — TRULICITY 1.5 MG/0.5 ML PEN: 1.5 | 28 days supply | Qty: 2 | Fill #3

## 2017-02-27 ENCOUNTER — Encounter: Payer: Self-pay | Admitting: Nurse Practitioner

## 2017-02-27 ENCOUNTER — Ambulatory Visit (INDEPENDENT_AMBULATORY_CARE_PROVIDER_SITE_OTHER): Payer: Self-pay | Admitting: Nurse Practitioner

## 2017-02-27 VITALS — BP 122/86 | HR 83 | Temp 98.7°F | Resp 16 | Wt 231.8 lb

## 2017-02-27 DIAGNOSIS — R42 Dizziness and giddiness: Secondary | ICD-10-CM

## 2017-02-27 DIAGNOSIS — H6981 Other specified disorders of Eustachian tube, right ear: Secondary | ICD-10-CM

## 2017-02-27 MED ORDER — MECLIZINE HCL 32 MG PO TABS: 32.0000 mg | ORAL_TABLET | Freq: Three times a day (TID) | ORAL | 0 refills | Status: AC | PRN

## 2017-02-27 MED FILL — MECLIZINE 25 MG TABLET: 25 | 10 days supply | Qty: 30 | Fill #0

## 2017-02-27 NOTE — Progress Notes (Signed)
Subjective:     Becky Simmons is a 47 y.o. female who presents for evaluation of dizziness. The symptoms started 2 days ago and are ongoing. The attacks occur intermittently and last a few seconds. Positions that worsen symptoms: standing up. Previous workup/treatments: none. Associated ear symptoms: otalgia. Associated CNS symptoms: headaches. Recent infections: none. Head trauma: denied. Drug ingestion: none. Noise exposure: no occupational exposure. Family history: patient's mother dxed with vertigo.  Patient states left ear started with severe pain, which now feels like pressure.  Patient rates current pain level 1/10.  Patient used Ibuprofen for pain with relief.  The following portions of the patient's history were reviewed and updated as appropriate: allergies, current medications and past medical history.  Review of Systems Constitutional: negative Eyes: negative Ears, nose, mouth, throat, and face: positive for earaches, negative for ear drainage, hoarseness, nasal congestion and sore throat Respiratory: negative Cardiovascular: negative Neurological: positive for headaches    Objective:    BP 122/86 (BP Location: Right Arm, Patient Position: Sitting, Cuff Size: Normal)   Pulse 83   Temp 98.7 F (37.1 C) (Oral)   Resp 16   Wt 231 lb 12.8 oz (105.1 kg)   SpO2 98%   BMI 33.26 kg/m  General appearance: alert, cooperative and no distress Head: Normocephalic, without obvious abnormality, atraumatic Eyes: conjunctivae/corneas clear. PERRL, EOM's intact. Fundi benign. Ears: normal TM and external ear canal left ear and abnormal TM right ear - moderate cerumen Nose: Nares normal. Septum midline. Mucosa normal. No drainage or sinus tenderness. Throat: lips, mucosa, and tongue normal; teeth and gums normal Lungs: clear to auscultation bilaterally Heart: regular rate and rhythm, S1, S2 normal, no murmur, click, rub or gallop Lymph nodes: cervical and submandibular nodes  normal Neurologic: Grossly normal      Assessment:    Vertigo and Eustachian Tube Dysfunction- Right    Plan:    Meclizine per medication orders. The nature of vertigo syndromes were discussed along with their usual course and treatment. Educational materials given and questions answered. Benadryl for Eustachian Tube Dysfunction.  Continue Ibuprofen for pain, fever or general discomfort. Patient verbalizes understanding.

## 2017-02-27 NOTE — Progress Notes (Signed)
Received call from pharmacy at [facility].  Pharmacy tech informed they do not have Meclizine 32mg  on hand.  Gave telephone order for Meclizine 25mg  prn up to three times daily for 10 days, #30, no refills.

## 2017-02-27 NOTE — Patient Instructions (Addendum)
Eustachian Tube Dysfunction The eustachian tube connects the middle ear to the back of the nose. It regulates air pressure in the middle ear by allowing air to move between the ear and nose. It also helps to drain fluid from the middle ear space. When the eustachian tube does not function properly, air pressure, fluid, or both can build up in the middle ear. Eustachian tube dysfunction can affect one or both ears. What are the causes? This condition happens when the eustachian tube becomes blocked or cannot open normally. This may result from:  Ear infections.  Colds and other upper respiratory infections.  Allergies.  Irritation, such as from cigarette smoke or acid from the stomach coming up into the esophagus (gastroesophageal reflux).  Sudden changes in air pressure, such as from descending in an airplane.  Abnormal growths in the nose or throat, such as nasal polyps, tumors, or enlarged tissue at the back of the throat (adenoids).  What increases the risk? This condition may be more likely to develop in people who smoke and people who are overweight. Eustachian tube dysfunction may also be more likely to develop in children, especially children who have:  Certain birth defects of the mouth, such as cleft palate.  Large tonsils and adenoids.  What are the signs or symptoms? Symptoms of this condition may include:  A feeling of fullness in the ear.  Ear pain.  Clicking or popping noises in the ear.  Ringing in the ear.  Hearing loss.  Loss of balance.  Symptoms may get worse when the air pressure around you changes, such as when you travel to an area of high elevation or fly on an airplane. How is this diagnosed? This condition may be diagnosed based on:  Your symptoms.  A physical exam of your ear, nose, and throat.  Tests, such as those that measure: ? The movement of your eardrum (tympanogram). ? Your hearing (audiometry).  How is this treated? Treatment  depends on the cause and severity of your condition. If your symptoms are mild, you may be able to relieve your symptoms by moving air into ("popping") your ears. If you have symptoms of fluid in your ears, treatment may include:  Decongestants.  Antihistamines.  Nasal sprays or ear drops that contain medicines that reduce swelling (steroids).  In some cases, you may need to have a procedure to drain the fluid in your eardrum (myringotomy). In this procedure, a small tube is placed in the eardrum to:  Drain the fluid.  Restore the air in the middle ear space.  Follow these instructions at home:  Take over-the-counter and prescription medicines only as told by your health care provider.  Use techniques to help pop your ears as recommended by your health care provider. These may include: ? Chewing gum. ? Yawning. ? Frequent, forceful swallowing. ? Closing your mouth, holding your nose closed, and gently blowing as if you are trying to blow air out of your nose.  Do not do any of the following until your health care provider approves: ? Travel to high altitudes. ? Fly in airplanes. ? Work in a pressurized cabin or room. ? Scuba dive.  Keep your ears dry. Dry your ears completely after showering or bathing.  Do not smoke.  Keep all follow-up visits as told by your health care provider. This is important. Contact a health care provider if:  Your symptoms do not go away after treatment.  Your symptoms come back after treatment.  You are   unable to pop your ears.  You have: ? A fever. ? Pain in your ear. ? Pain in your head or neck. ? Fluid draining from your ear.  Your hearing suddenly changes.  You become very dizzy.  You lose your balance. This information is not intended to replace advice given to you by your health care provider. Make sure you discuss any questions you have with your health care provider. Document Released: 03/06/2015 Document Revised: 07/16/2015  Document Reviewed: 02/26/2014 Elsevier Interactive Patient Education  2018 [employer]. Vertigo Vertigo is the feeling that you or your surroundings are moving when they are not. Vertigo can be dangerous if it occurs while you are doing something that could endanger you or others, such as driving. What are the causes? This condition is caused by a disturbance in the signals that are sent by your body's sensory systems to your brain. Different causes of a disturbance can lead to vertigo, including:  Infections, especially in the inner ear.  A bad reaction to a drug, or misuse of alcohol and medicines.  Withdrawal from drugs or alcohol.  Quickly changing positions, as when lying down or rolling over in bed.  Migraine headaches.  Decreased blood flow to the brain.  Decreased blood pressure.  Increased pressure in the brain from a head or neck injury, stroke, infection, tumor, or bleeding.  Central nervous system disorders.  What are the signs or symptoms? Symptoms of this condition usually occur when you move your head or your eyes in different directions. Symptoms may start suddenly, and they usually last for less than a minute. Symptoms may include:  Loss of balance and falling.  Feeling like you are spinning or moving.  Feeling like your surroundings are spinning or moving.  Nausea and vomiting.  Blurred vision or double vision.  Difficulty hearing.  Slurred speech.  Dizziness.  Involuntary eye movement (nystagmus).  Symptoms can be mild and cause only slight annoyance, or they can be severe and interfere with daily life. Episodes of vertigo may return (recur) over time, and they are often triggered by certain movements. Symptoms may improve over time. How is this diagnosed? This condition may be diagnosed based on medical history and the quality of your nystagmus. Your health care provider may test your eye movements by asking you to quickly change positions to  trigger the nystagmus. This may be called the Dix-Hallpike test, head thrust test, or roll test. You may be referred to a health care provider who specializes in ear, nose, and throat (ENT) problems (otolaryngologist) or a provider who specializes in disorders of the central nervous system (neurologist). You may have additional testing, including:  A physical exam.  Blood tests.  MRI.  A CT scan.  An electrocardiogram (ECG). This records electrical activity in your heart.  An electroencephalogram (EEG). This records electrical activity in your brain.  Hearing tests.  How is this treated? Treatment for this condition depends on the cause and the severity of the symptoms. Treatment options include:  Medicines to treat nausea or vertigo. These are usually used for severe cases. Some medicines that are used to treat other conditions may also reduce or eliminate vertigo symptoms. These include: ? Medicines that control allergies (antihistamines). ? Medicines that control seizures (anticonvulsants). ? Medicines that relieve depression (antidepressants). ? Medicines that relieve anxiety (sedatives).  Head movements to adjust your inner ear back to normal. If your vertigo is caused by an ear problem, your health care provider may recommend certain movements  to correct the problem.  Surgery. This is rare.  Follow these instructions at home: Safety  Move slowly.Avoid sudden body or head movements.  Avoid driving.  Avoid operating heavy machinery.  Avoid doing any tasks that would cause danger to you or others if you would have a vertigo episode during the task.  If you have trouble walking or keeping your balance, try using a cane for stability. If you feel dizzy or unstable, sit down right away.  Return to your normal activities as told by your health care provider. Ask your health care provider what activities are safe for you. General instructions  Take over-the-counter and  prescription medicines only as told by your health care provider.  Avoid certain positions or movements as told by your health care provider.  Drink enough fluid to keep your urine clear or pale yellow.  Keep all follow-up visits as told by your health care provider. This is important. Contact a health care provider if:  Your medicines do not relieve your vertigo or they make it worse.  You have a fever.  Your condition gets worse or you develop new symptoms.  Your family or friends notice any behavioral changes.  Your nausea or vomiting gets worse.  You have numbness or a "pins and needles" sensation in part of your body. Get help right away if:  You have difficulty moving or speaking.  You are always dizzy.  You faint.  You develop severe headaches.  You have weakness in your hands, arms, or legs.  You have changes in your hearing or vision.  You develop a stiff neck.  You develop sensitivity to light. This information is not intended to replace advice given to you by your health care provider. Make sure you discuss any questions you have with your health care provider. Document Released: 11/17/2004 Document Revised: 07/22/2015 Document Reviewed: 06/02/2014 Elsevier Interactive Patient Education  [employer].

## 2017-03-01 ENCOUNTER — Telehealth: Payer: Self-pay | Admitting: Nurse Practitioner

## 2017-03-01 NOTE — Telephone Encounter (Signed)
Called patient to follow up on her status.  Spoke with the patient and she informed that she has been taking Benadryl, which has helped her.  Patient stated she has not picked up the prescription for the Meclizine, and did not because her dizziness is resolving.  Informed patient to continue the Benadryl to see if it continues to resolve the symptoms. Patient verbalized understanding.

## 2017-03-02 DIAGNOSIS — Z0000 Encounter for general adult medical examination without abnormal findings: Secondary | ICD-10-CM | POA: Diagnosis not present

## 2017-03-02 DIAGNOSIS — D649 Anemia, unspecified: Secondary | ICD-10-CM | POA: Diagnosis not present

## 2017-03-03 DIAGNOSIS — L309 Dermatitis, unspecified: Secondary | ICD-10-CM | POA: Diagnosis not present

## 2017-03-03 DIAGNOSIS — D649 Anemia, unspecified: Secondary | ICD-10-CM | POA: Diagnosis not present

## 2017-03-03 DIAGNOSIS — Z1231 Encounter for screening mammogram for malignant neoplasm of breast: Secondary | ICD-10-CM | POA: Diagnosis not present

## 2017-03-03 DIAGNOSIS — Z124 Encounter for screening for malignant neoplasm of cervix: Secondary | ICD-10-CM | POA: Diagnosis not present

## 2017-03-03 DIAGNOSIS — N920 Excessive and frequent menstruation with regular cycle: Secondary | ICD-10-CM | POA: Diagnosis not present

## 2017-03-03 DIAGNOSIS — R21 Rash and other nonspecific skin eruption: Secondary | ICD-10-CM | POA: Diagnosis not present

## 2017-03-03 DIAGNOSIS — Z0000 Encounter for general adult medical examination without abnormal findings: Secondary | ICD-10-CM | POA: Diagnosis not present

## 2017-03-03 MED FILL — PRAVASTATIN NA 40 MG TAB: 40 | 90 days supply | Qty: 90 | Fill #0

## 2017-03-03 MED FILL — CLOBETASOL PROPIONATE 0.05: 0.05 | 30 days supply | Qty: 60 | Fill #0

## 2017-03-17 MED FILL — TRULICITY 1.5 MG/0.5 ML PEN: 1.5 | 28 days supply | Qty: 2 | Fill #4

## 2017-03-31 DIAGNOSIS — Z124 Encounter for screening for malignant neoplasm of cervix: Secondary | ICD-10-CM | POA: Diagnosis not present

## 2017-03-31 DIAGNOSIS — N852 Hypertrophy of uterus: Secondary | ICD-10-CM | POA: Diagnosis not present

## 2017-03-31 DIAGNOSIS — Z1151 Encounter for screening for human papillomavirus (HPV): Secondary | ICD-10-CM | POA: Diagnosis not present

## 2017-03-31 DIAGNOSIS — N920 Excessive and frequent menstruation with regular cycle: Secondary | ICD-10-CM | POA: Diagnosis not present

## 2017-03-31 DIAGNOSIS — D500 Iron deficiency anemia secondary to blood loss (chronic): Secondary | ICD-10-CM | POA: Diagnosis not present

## 2017-03-31 DIAGNOSIS — D251 Intramural leiomyoma of uterus: Secondary | ICD-10-CM | POA: Diagnosis not present

## 2017-04-20 MED FILL — CARVEDILOL 12.5 MG TABLET: 12.5 | 30 days supply | Qty: 60 | Fill #3

## 2017-04-20 MED FILL — TRULICITY 1.5 MG/0.5 ML PEN: 1.5 | 28 days supply | Qty: 2 | Fill #5

## 2017-04-25 DIAGNOSIS — N852 Hypertrophy of uterus: Secondary | ICD-10-CM | POA: Diagnosis not present

## 2017-04-25 DIAGNOSIS — D500 Iron deficiency anemia secondary to blood loss (chronic): Secondary | ICD-10-CM | POA: Diagnosis not present

## 2017-04-25 DIAGNOSIS — R195 Other fecal abnormalities: Secondary | ICD-10-CM | POA: Diagnosis not present

## 2017-04-25 DIAGNOSIS — N920 Excessive and frequent menstruation with regular cycle: Secondary | ICD-10-CM | POA: Diagnosis not present

## 2017-04-25 DIAGNOSIS — D251 Intramural leiomyoma of uterus: Secondary | ICD-10-CM | POA: Diagnosis not present

## 2017-05-01 DIAGNOSIS — M4726 Other spondylosis with radiculopathy, lumbar region: Secondary | ICD-10-CM | POA: Diagnosis not present

## 2017-05-01 DIAGNOSIS — M4722 Other spondylosis with radiculopathy, cervical region: Secondary | ICD-10-CM | POA: Diagnosis not present

## 2017-05-01 DIAGNOSIS — M4802 Spinal stenosis, cervical region: Secondary | ICD-10-CM | POA: Diagnosis not present

## 2017-05-01 MED FILL — GABAPENTIN 300 MG CAPSULE: 300 | 30 days supply | Qty: 30 | Fill #0

## 2017-05-02 ENCOUNTER — Other Ambulatory Visit (HOSPITAL_BASED_OUTPATIENT_CLINIC_OR_DEPARTMENT_OTHER): Payer: Self-pay | Admitting: Orthopedic Surgery

## 2017-05-02 DIAGNOSIS — M4802 Spinal stenosis, cervical region: Secondary | ICD-10-CM

## 2017-05-02 DIAGNOSIS — M4722 Other spondylosis with radiculopathy, cervical region: Secondary | ICD-10-CM

## 2017-05-05 MED FILL — JANUVIA 100 MG TABLET: 100 | 30 days supply | Qty: 30 | Fill #0

## 2017-05-06 ENCOUNTER — Ambulatory Visit (HOSPITAL_BASED_OUTPATIENT_CLINIC_OR_DEPARTMENT_OTHER)
Admission: RE | Admit: 2017-05-06 | Discharge: 2017-05-06 | Disposition: A | Discharge status: Home / Self Care | Payer: 59 | Source: Ambulatory Visit | Attending: Orthopedic Surgery | Admitting: Orthopedic Surgery

## 2017-05-06 DIAGNOSIS — M4802 Spinal stenosis, cervical region: Secondary | ICD-10-CM | POA: Insufficient documentation

## 2017-05-06 DIAGNOSIS — M4722 Other spondylosis with radiculopathy, cervical region: Secondary | ICD-10-CM | POA: Diagnosis not present

## 2017-05-06 DIAGNOSIS — M50223 Other cervical disc displacement at C6-C7 level: Secondary | ICD-10-CM | POA: Diagnosis not present

## 2017-05-15 MED FILL — TRULICITY 1.5 MG/0.5 ML PEN: 1.5 | 28 days supply | Qty: 2 | Fill #6

## 2017-05-16 MED FILL — FREESTYLE LANCETS: 30 days supply | Qty: 100 | Fill #0

## 2017-05-16 MED FILL — FREESTYLE LITE METER: 1 days supply | Qty: 1 | Fill #0

## 2017-05-17 MED FILL — ACCU-CHEK GUIDE TEST STRIP: 50 days supply | Qty: 100 | Fill #0

## 2017-05-25 DIAGNOSIS — M4802 Spinal stenosis, cervical region: Secondary | ICD-10-CM | POA: Diagnosis not present

## 2017-05-25 DIAGNOSIS — Z6833 Body mass index (BMI) 33.0-33.9, adult: Secondary | ICD-10-CM | POA: Diagnosis not present

## 2017-05-25 DIAGNOSIS — M4722 Other spondylosis with radiculopathy, cervical region: Secondary | ICD-10-CM | POA: Diagnosis not present

## 2017-05-25 MED FILL — CARVEDILOL 12.5 MG TABLET: 12.5 | 30 days supply | Qty: 60 | Fill #4

## 2017-05-25 MED FILL — GABAPENTIN 300 MG CAPSULE: 300 | 30 days supply | Qty: 60 | Fill #0

## 2017-05-31 DIAGNOSIS — R195 Other fecal abnormalities: Secondary | ICD-10-CM | POA: Diagnosis not present

## 2017-05-31 DIAGNOSIS — N920 Excessive and frequent menstruation with regular cycle: Secondary | ICD-10-CM | POA: Diagnosis not present

## 2017-05-31 DIAGNOSIS — D500 Iron deficiency anemia secondary to blood loss (chronic): Secondary | ICD-10-CM | POA: Diagnosis not present

## 2017-05-31 MED FILL — PRAVASTATIN NA 40 MG TAB: 40 | 90 days supply | Qty: 90 | Fill #1

## 2017-06-08 DIAGNOSIS — M4802 Spinal stenosis, cervical region: Secondary | ICD-10-CM | POA: Diagnosis not present

## 2017-06-15 MED FILL — TRULICITY 1.5 MG/0.5 ML PEN: 1.5 | 28 days supply | Qty: 2 | Fill #7

## 2017-06-15 MED FILL — JANUVIA 100 MG TABLET: 100 | 30 days supply | Qty: 30 | Fill #1

## 2017-06-22 DIAGNOSIS — K641 Second degree hemorrhoids: Secondary | ICD-10-CM | POA: Diagnosis not present

## 2017-06-22 DIAGNOSIS — K640 First degree hemorrhoids: Secondary | ICD-10-CM | POA: Diagnosis not present

## 2017-06-22 DIAGNOSIS — D122 Benign neoplasm of ascending colon: Secondary | ICD-10-CM | POA: Diagnosis not present

## 2017-06-22 DIAGNOSIS — D500 Iron deficiency anemia secondary to blood loss (chronic): Secondary | ICD-10-CM | POA: Diagnosis not present

## 2017-06-22 DIAGNOSIS — Z1211 Encounter for screening for malignant neoplasm of colon: Secondary | ICD-10-CM | POA: Diagnosis not present

## 2017-06-22 DIAGNOSIS — D123 Benign neoplasm of transverse colon: Secondary | ICD-10-CM | POA: Diagnosis not present

## 2017-06-23 MED FILL — CARVEDILOL 12.5 MG TABLET: 12.5 | 30 days supply | Qty: 60 | Fill #5

## 2017-06-23 MED FILL — GABAPENTIN 300 MG CAPSULE: 300 | 30 days supply | Qty: 60 | Fill #1

## 2017-06-28 MED FILL — PEG 3350/ELECTROLYTE SOLN: 240 | 1 days supply | Qty: 4000 | Fill #0

## 2017-07-03 DIAGNOSIS — M4802 Spinal stenosis, cervical region: Secondary | ICD-10-CM | POA: Diagnosis not present

## 2017-07-11 ENCOUNTER — Emergency Department (HOSPITAL_COMMUNITY)
Admission: EM | Admit: 2017-07-11 | Discharge: 2017-07-12 | Disposition: A | Discharge status: Home / Self Care | Payer: 59 | Attending: Emergency Medicine | Admitting: Emergency Medicine

## 2017-07-11 ENCOUNTER — Encounter (HOSPITAL_COMMUNITY): Payer: Self-pay | Admitting: Emergency Medicine

## 2017-07-11 DIAGNOSIS — Z79899 Other long term (current) drug therapy: Secondary | ICD-10-CM | POA: Diagnosis not present

## 2017-07-11 DIAGNOSIS — M5412 Radiculopathy, cervical region: Secondary | ICD-10-CM | POA: Insufficient documentation

## 2017-07-11 DIAGNOSIS — Z7984 Long term (current) use of oral hypoglycemic drugs: Secondary | ICD-10-CM | POA: Insufficient documentation

## 2017-07-11 DIAGNOSIS — Z7902 Long term (current) use of antithrombotics/antiplatelets: Secondary | ICD-10-CM | POA: Insufficient documentation

## 2017-07-11 DIAGNOSIS — I10 Essential (primary) hypertension: Secondary | ICD-10-CM | POA: Insufficient documentation

## 2017-07-11 DIAGNOSIS — M542 Cervicalgia: Secondary | ICD-10-CM | POA: Insufficient documentation

## 2017-07-11 DIAGNOSIS — M79602 Pain in left arm: Secondary | ICD-10-CM | POA: Diagnosis not present

## 2017-07-11 DIAGNOSIS — R200 Anesthesia of skin: Secondary | ICD-10-CM | POA: Insufficient documentation

## 2017-07-11 DIAGNOSIS — E119 Type 2 diabetes mellitus without complications: Secondary | ICD-10-CM | POA: Diagnosis not present

## 2017-07-11 DIAGNOSIS — R531 Weakness: Secondary | ICD-10-CM | POA: Diagnosis not present

## 2017-07-11 HISTORY — DX: Other cervical disc degeneration, unspecified cervical region: M50.30

## 2017-07-11 HISTORY — DX: Enthesopathy, unspecified: M77.9

## 2017-07-11 HISTORY — DX: Other injury of unspecified body region, initial encounter: T14.8XXA

## 2017-07-11 LAB — CBC
HCT: 32.2 % — ABNORMAL LOW (ref 36.0–46.0)
Hemoglobin: 10 g/dL — ABNORMAL LOW (ref 12.0–15.0)
MCH: 25.2 pg — ABNORMAL LOW (ref 26.0–34.0)
MCHC: 31.1 g/dL (ref 30.0–36.0)
MCV: 81.1 fL (ref 78.0–100.0)
PLATELETS: 235 10*3/uL (ref 150–400)
RBC: 3.97 MIL/uL (ref 3.87–5.11)
RDW: 14.2 % (ref 11.5–15.5)
WBC: 6.4 10*3/uL (ref 4.0–10.5)

## 2017-07-11 NOTE — ED Triage Notes (Signed)
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

## 2017-07-12 ENCOUNTER — Emergency Department (HOSPITAL_COMMUNITY): Payer: 59

## 2017-07-12 DIAGNOSIS — Z7902 Long term (current) use of antithrombotics/antiplatelets: Secondary | ICD-10-CM | POA: Diagnosis not present

## 2017-07-12 DIAGNOSIS — Z79899 Other long term (current) drug therapy: Secondary | ICD-10-CM | POA: Diagnosis not present

## 2017-07-12 DIAGNOSIS — M542 Cervicalgia: Secondary | ICD-10-CM | POA: Diagnosis not present

## 2017-07-12 DIAGNOSIS — R200 Anesthesia of skin: Secondary | ICD-10-CM | POA: Diagnosis not present

## 2017-07-12 DIAGNOSIS — E119 Type 2 diabetes mellitus without complications: Secondary | ICD-10-CM | POA: Diagnosis not present

## 2017-07-12 DIAGNOSIS — I10 Essential (primary) hypertension: Secondary | ICD-10-CM | POA: Diagnosis not present

## 2017-07-12 DIAGNOSIS — Z7984 Long term (current) use of oral hypoglycemic drugs: Secondary | ICD-10-CM | POA: Diagnosis not present

## 2017-07-12 DIAGNOSIS — M5412 Radiculopathy, cervical region: Secondary | ICD-10-CM | POA: Diagnosis not present

## 2017-07-12 LAB — BASIC METABOLIC PANEL
Anion gap: 9 (ref 5–15)
BUN: 9 mg/dL (ref 6–20)
CO2: 25 mmol/L (ref 22–32)
CREATININE: 0.83 mg/dL (ref 0.44–1.00)
Calcium: 8.6 mg/dL — ABNORMAL LOW (ref 8.9–10.3)
Chloride: 103 mmol/L (ref 101–111)
GFR calc Af Amer: >60 mL/min (ref >60)
GFR calc non Af Amer: >60 mL/min (ref >60)
Glucose, Bld: 170 mg/dL — ABNORMAL HIGH (ref 65–99)
POTASSIUM: 3.8 mmol/L (ref 3.5–5.1)
Sodium: 137 mmol/L (ref 135–145)

## 2017-07-12 LAB — I-STAT TROPONIN, ED: Troponin i, poc: 0 ng/mL (ref 0.00–0.08)

## 2017-07-12 LAB — I-STAT BETA HCG BLOOD, ED (MC, WL, AP ONLY): I-stat hCG, quantitative: <5 m[IU]/mL (ref <5)

## 2017-07-12 MED ORDER — IBUPROFEN 800 MG PO TABS
800.0000 mg | ORAL_TABLET | Freq: Once | ORAL | Status: AC
  Administered 2017-07-12: 800 mg via ORAL
  Filled 2017-07-12: qty 1

## 2017-07-12 MED ORDER — OXYCODONE HCL 5 MG PO TABS
5.0000 mg | ORAL_TABLET | Freq: Once | ORAL | Status: AC
  Administered 2017-07-12: 5 mg via ORAL
  Filled 2017-07-12: qty 1

## 2017-07-12 MED ORDER — ACETAMINOPHEN 500 MG PO TABS
1000.0000 mg | ORAL_TABLET | Freq: Once | ORAL | Status: AC
  Administered 2017-07-12: 1000 mg via ORAL
  Filled 2017-07-12: qty 2

## 2017-07-12 MED ORDER — DIAZEPAM 5 MG PO TABS
5.0000 mg | ORAL_TABLET | Freq: Once | ORAL | Status: AC
  Administered 2017-07-12: 5 mg via ORAL
  Filled 2017-07-12: qty 1

## 2017-07-12 MED ORDER — GADOBENATE DIMEGLUMINE 529 MG/ML IV SOLN
15.0000 mL | Freq: Once | INTRAVENOUS | Status: AC | PRN
  Administered 2017-07-12: 15 mL via INTRAVENOUS

## 2017-07-12 MED ORDER — DIAZEPAM 5 MG PO TABS: 5.0000 mg | ORAL_TABLET | Freq: Two times a day (BID) | ORAL | 0 refills | Status: DC

## 2017-07-12 MED FILL — JANUVIA 100 MG TABLET: 100 | 30 days supply | Qty: 30 | Fill #2

## 2017-07-12 MED FILL — TRULICITY 1.5 MG/0.5 ML PEN: 1.5 | 28 days supply | Qty: 2 | Fill #8

## 2017-07-12 MED FILL — GABAPENTIN 300 MG CAPSULE: 300 | 20 days supply | Qty: 120 | Fill #0

## 2017-07-12 MED FILL — diazePAM 5 MG TABS: 5 | 5 days supply | Qty: 10 | Fill #0

## 2017-07-12 NOTE — Discharge Instructions (Signed)
Follow up with your spine doctor.   Take 4 over the counter ibuprofen tablets 3 times a day or 2 over-the-counter naproxen tablets twice a day for pain. Also take tylenol 1000mg (2 extra strength) four times a day.

## 2017-07-12 NOTE — ED Notes (Signed)
Patient transported to MRI 

## 2017-07-12 NOTE — ED Provider Notes (Signed)
[place] EMERGENCY DEPARTMENT Provider Note   CSN: [id number] Arrival date & time: 07/11/17  2326     History   Chief Complaint Chief Complaint  Patient presents with  . Neck Injury  . Arm Pain  . Numbness    HPI Becky Simmons is a 47 y.o. female.  47 yo F with a chief complaint of left-sided arm pain.  This been going on for the past couple hours.  She feels a sharp stabbing pain that goes from her neck down the length of her arm.  She has had problems with her C-spine recently, recently had an MRI that showed a stenosis at C6 and 7 had a spinal injection performed less than a week ago.  She denies fevers or chills.  Pain is sharp and stabbing and seems to come and go.  She denies weakness or numbness.  The history is provided by the patient.  Neck Injury  Pertinent negatives include no chest pain, no abdominal pain, no headaches and no shortness of breath.  Arm Pain  Pertinent negatives include no chest pain, no abdominal pain, no headaches and no shortness of breath.  Illness  This is a recurrent problem. The current episode started 3 to 5 hours ago. The problem occurs constantly. The problem has not changed since onset.Pertinent negatives include no chest pain, no abdominal pain, no headaches and no shortness of breath. Nothing aggravates the symptoms. Nothing relieves the symptoms. She has tried nothing for the symptoms. The treatment provided no relief.    Past Medical History:  Diagnosis Date  . Bone spur    cervical  . DDD (degenerative disc disease), cervical   . Hypertension   . Migraine   . Nerve compression   . Type II or unspecified type diabetes mellitus without mention of complication, not stated as uncontrolled     Patient Active Problem List   Diagnosis Date Noted  . Diabetes ([place]) 01/19/2015  . Need for prophylactic vaccination against Streptococcus pneumoniae (pneumococcus) 07/27/2012  . Type II or unspecified type diabetes  mellitus without mention of complication, uncontrolled 07/27/2012    Past Surgical History:  Procedure Laterality Date  . BUNIONECTOMY Bilateral   . CESAREAN SECTION  2012  . MYOMECTOMY    . TUBAL LIGATION    . TUBAL LIGATION  02/2010     OB History    Gravida  1   Para      Term      Preterm      AB      Living        SAB      TAB      Ectopic      Multiple      Live Births               Home Medications    Prior to Admission medications   Medication Sig Start Date End Date Taking? Authorizing Provider  carvedilol (COREG) 12.5 MG tablet Take 12.5 mg by mouth 2 (two) times daily with a meal.   Yes [provider]  Dulaglutide (TRULICITY) 2.95 MW/4.1LK SOPN Inject 0.75 mg into the skin once a week.   Yes [provider]  gabapentin (NEURONTIN) 300 MG capsule Take 600 mg by mouth 2 (two) times daily. 06/23/17  Yes [provider]  pravastatin (PRAVACHOL) 10 MG tablet Take 10 mg by mouth daily.   Yes [provider]  sitaGLIPtin (JANUVIA) 100 MG tablet Take 100 mg  by mouth daily.   Yes [provider]  timolol (BETIMOL) 0.5 % ophthalmic solution Place 1 drop into the right eye daily.   Yes [provider]  cyclobenzaprine (FLEXERIL) 10 MG tablet Take 1 tablet (10 mg total) by mouth 2 (two) times daily as needed for muscle spasms. Patient not taking: Reported on 02/27/2017 01/29/16   Frederica Kuster, PA-C  diazepam (VALIUM) 5 MG tablet Take 1 tablet (5 mg total) by mouth 2 (two) times daily. 07/12/17   Deno Etienne, DO  naproxen (NAPROSYN) 500 MG tablet Take 1 tablet (500 mg total) by mouth 2 (two) times daily. Patient not taking: Reported on 02/27/2017 01/29/16   Frederica Kuster, PA-C  traMADol (ULTRAM) 50 MG tablet Take 1 tablet (50 mg total) by mouth every 6 (six) hours as needed for severe pain. Patient not taking: Reported on 02/27/2017 01/29/16   Frederica Kuster, PA-C    Family History Family History  Problem  Relation Age of Onset  . Diabetes Mother   . Hypertension Father   . Cancer Father        Prostate Cancer  . Diabetes Maternal Grandmother   . Diabetes Paternal Grandmother     Social History Social History   Tobacco Use  . Smoking status: Never Smoker  . Smokeless tobacco: Never Used  Substance Use Topics  . Alcohol use: Yes    Comment: Occasional  . Drug use: No     Allergies   Invokana [canagliflozin] and Metformin and related   Review of Systems Review of Systems  Constitutional: Negative for chills and fever.  HENT: Negative for congestion and rhinorrhea.   Eyes: Negative for redness and visual disturbance.  Respiratory: Negative for shortness of breath and wheezing.   Cardiovascular: Negative for chest pain and palpitations.  Gastrointestinal: Negative for abdominal pain, nausea and vomiting.  Genitourinary: Negative for dysuria and urgency.  Musculoskeletal: Positive for arthralgias and myalgias.  Skin: Negative for pallor and wound.  Neurological: Negative for dizziness and headaches.     Physical Exam Updated Vital Signs BP 124/80   Pulse 75   Temp 98.2 F (36.8 C) (Oral)   Resp (!) 22   Ht 5\' 10"  (1.778 m)   Wt 105.7 kg (233 lb)   LMP 06/18/2017   SpO2 100%   BMI 33.43 kg/m   Physical Exam  Constitutional: She is oriented to person, place, and time. She appears well-developed and well-nourished. No distress.  HENT:  Head: Normocephalic and atraumatic.  Eyes: Pupils are equal, round, and reactive to light. EOM are normal.  Neck: Normal range of motion. Neck supple.  Cardiovascular: Normal rate and regular rhythm. Exam reveals no gallop and no friction rub.  No murmur heard. Pulmonary/Chest: Effort normal. She has no wheezes. She has no rales.  Abdominal: Soft. She exhibits no distension. There is no tenderness.  Musculoskeletal: She exhibits no edema or tenderness.  Pulse motor and sensation is intact to left upper extremity.  Negative  Spurling's test.  Strength is equal to the right upper extremity.  Neurological: She is alert and oriented to person, place, and time.  Skin: Skin is warm and dry. She is not diaphoretic.  Psychiatric: She has a normal mood and affect. Her behavior is normal.  Nursing note and vitals reviewed.    ED Treatments / Results  Labs (all labs ordered are listed, but only abnormal results are displayed) Labs Reviewed  BASIC METABOLIC PANEL - Abnormal; Notable for the following components:  Result Value   Glucose, Bld 170 (*)    Calcium 8.6 (*)    All other components within normal limits  CBC - Abnormal; Notable for the following components:   Hemoglobin 10.0 (*)    HCT 32.2 (*)    MCH 25.2 (*)    All other components within normal limits  I-STAT TROPONIN, ED  I-STAT BETA HCG BLOOD, ED ([facility], [facility], [facility] ONLY)    EKG EKG Interpretation  Date/Time:  Tuesday Jul 11 2017 23:36:10 EDT Ventricular Rate:  85 PR Interval:  148 QRS Duration: 94 QT Interval:  382 QTC Calculation: 956 R Axis:   74 Text Interpretation:  Normal sinus rhythm ST & T wave abnormality, consider anterior ischemia Abnormal ECG No old tracing to compare Confirmed by Deno Etienne [phone]) on 07/11/2017 11:55:40 PM   Radiology Mr Cervical Spine W Or Wo Contrast  Result Date: 07/12/2017 CLINICAL DATA:  Severe LEFT neck and arm pain beginning 1 hour ago. Status post spine steroid injection 2 days ago. EXAM: MRI CERVICAL SPINE WITHOUT AND WITH CONTRAST TECHNIQUE: Multiplanar and multiecho pulse sequences of the cervical spine, to include the craniocervical junction and cervicothoracic junction, were obtained without and with intravenous contrast. CONTRAST:  61mL MULTIHANCE GADOBENATE DIMEGLUMINE 529 MG/ML IV SOLN COMPARISON:  MRI of the cervical spine May 06, 2017 FINDINGS: ALIGNMENT: Straightened cervical lordosis.  No malalignment. VERTEBRAE/DISCS: Vertebral bodies are intact. Stable moderate to severe C6-7 disc height loss,  moderate at C3-4 in C5-6 with similar mild disc edema C3-4, increasing moderate acute on chronic discogenic endplate changes M5-7. Stable moderate chronic discogenic endplate changes Q4-6 and C6-7. No suspicious bone marrow signal. No abnormal osseous or disc enhancement. CORD:Cervical spinal cord is normal morphology and signal characteristics from the cervicomedullary junction to level of T2-3, the most caudal well visualized level. No abnormal cord, leptomeningeal or epidural enhancement. POSTERIOR FOSSA, VERTEBRAL ARTERIES, PARASPINAL TISSUES: No MR findings of ligamentous injury. Vertebral artery flow voids present. Included posterior fossa and paraspinal soft tissues are normal. DISC LEVELS: C2-3: No disc bulge, canal stenosis nor neural foraminal narrowing. Mild facet arthropathy. C3-4: Small broad-based disc bulge, uncovertebral hypertrophy and mild facet arthropathy. Mild canal stenosis. Mild to moderate RIGHT neural foraminal narrowing. C4-5: No disc bulge, canal stenosis nor neural foraminal narrowing. Mild uncovertebral hypertrophy. C5-6: Small broad-based disc bulge, uncovertebral hypertrophy. No canal stenosis or neural foraminal narrowing. C6-7: Small broad-based disc bulge/central disc protrusion. Uncovertebral hypertrophy. Minimal canal stenosis. Moderate to severe RIGHT, mild LEFT neural foraminal narrowing is similar. C7-T1: Small broad-based disc bulge asymmetric to the RIGHT, uncovertebral hypertrophy. No canal stenosis. Mild RIGHT greater than LEFT neural foraminal narrowing. IMPRESSION: 1. No fracture or malalignment. Similar degenerative change of the cervical spine. 2. Mild canal stenosis C3-4, minimal canal stenosis C6-7. 3. Neural foraminal narrowing C3-4, C6-7 and C7-T1: Moderate to severe on the RIGHT at C6-7. Electronically Signed   By: Elon Alas M.D.   On: 07/12/2017 03:00    Procedures Procedures (including critical care time)  Medications Ordered in ED Medications    acetaminophen (TYLENOL) tablet 1,000 mg (1,000 mg Oral Given 07/12/17 0108)  ibuprofen (ADVIL,MOTRIN) tablet 800 mg (800 mg Oral Given 07/12/17 0108)  oxyCODONE (Oxy IR/ROXICODONE) immediate release tablet 5 mg (5 mg Oral Given 07/12/17 0108)  diazepam (VALIUM) tablet 5 mg (5 mg Oral Given 07/12/17 0108)  gadobenate dimeglumine (MULTIHANCE) injection 15 mL (15 mLs Intravenous Contrast Given 07/12/17 0232)     Initial Impression / Assessment and Plan / ED  Course  I have reviewed the triage vital signs and the nursing notes.  Pertinent labs & imaging results that were available during my care of the patient were reviewed by me and considered in my medical decision making (see chart for details).     47 yo F with a chief complaint of neck pain.  This radiates down her left arm.  The patient had a recent spinal injection making her at increased risk for a epidural hematoma.  Will obtain an MRI.  MRI is negative for epidural hematoma.  No noted critical stenosis by radiology.  We will have her follow-up with her spinal doctor.  3:46 AM:  I have discussed the diagnosis/risks/treatment options with the patient and believe the pt to be eligible for discharge home to follow-up with PCP. We also discussed returning to the ED immediately if new or worsening sx occur. We discussed the sx which are most concerning (e.g., sudden worsening pain, weakness, numbness) that necessitate immediate return. Medications administered to the patient during their visit and any new prescriptions provided to the patient are listed below.  Medications given during this visit Medications  acetaminophen (TYLENOL) tablet 1,000 mg (1,000 mg Oral Given 07/12/17 0108)  ibuprofen (ADVIL,MOTRIN) tablet 800 mg (800 mg Oral Given 07/12/17 0108)  oxyCODONE (Oxy IR/ROXICODONE) immediate release tablet 5 mg (5 mg Oral Given 07/12/17 0108)  diazepam (VALIUM) tablet 5 mg (5 mg Oral Given 07/12/17 0108)  gadobenate dimeglumine (MULTIHANCE)  injection 15 mL (15 mLs Intravenous Contrast Given 07/12/17 0232)     The patient appears reasonably screen and/or stabilized for discharge and I doubt any other medical condition or other [facility] requiring further screening, evaluation, or treatment in the ED at this time prior to discharge.    Final Clinical Impressions(s) / ED Diagnoses   Final diagnoses:  Cervical radiculopathy    ED Discharge Orders        Ordered    diazepam (VALIUM) 5 MG tablet  2 times daily     07/12/17 Kathryn, [place], DO 07/12/17 [phone]

## 2017-07-20 ENCOUNTER — Emergency Department (HOSPITAL_BASED_OUTPATIENT_CLINIC_OR_DEPARTMENT_OTHER)
Admission: EM | Admit: 2017-07-20 | Discharge: 2017-07-20 | Disposition: A | Discharge status: Home / Self Care | Payer: 59 | Attending: Emergency Medicine | Admitting: Emergency Medicine

## 2017-07-20 ENCOUNTER — Other Ambulatory Visit: Payer: Self-pay

## 2017-07-20 ENCOUNTER — Encounter (HOSPITAL_BASED_OUTPATIENT_CLINIC_OR_DEPARTMENT_OTHER): Payer: Self-pay | Admitting: Adult Health

## 2017-07-20 DIAGNOSIS — I10 Essential (primary) hypertension: Secondary | ICD-10-CM | POA: Insufficient documentation

## 2017-07-20 DIAGNOSIS — E119 Type 2 diabetes mellitus without complications: Secondary | ICD-10-CM | POA: Diagnosis not present

## 2017-07-20 DIAGNOSIS — Z79899 Other long term (current) drug therapy: Secondary | ICD-10-CM | POA: Diagnosis not present

## 2017-07-20 DIAGNOSIS — M4722 Other spondylosis with radiculopathy, cervical region: Secondary | ICD-10-CM | POA: Diagnosis not present

## 2017-07-20 DIAGNOSIS — R519 Headache, unspecified: Secondary | ICD-10-CM

## 2017-07-20 DIAGNOSIS — R51 Headache: Secondary | ICD-10-CM | POA: Diagnosis not present

## 2017-07-20 DIAGNOSIS — Z794 Long term (current) use of insulin: Secondary | ICD-10-CM | POA: Insufficient documentation

## 2017-07-20 DIAGNOSIS — R5383 Other fatigue: Secondary | ICD-10-CM | POA: Insufficient documentation

## 2017-07-20 DIAGNOSIS — M4726 Other spondylosis with radiculopathy, lumbar region: Secondary | ICD-10-CM | POA: Diagnosis not present

## 2017-07-20 DIAGNOSIS — M4802 Spinal stenosis, cervical region: Secondary | ICD-10-CM | POA: Diagnosis not present

## 2017-07-20 LAB — CBC WITH DIFFERENTIAL/PLATELET
BASOS ABS: 0 10*3/uL (ref 0.0–0.1)
BASOS PCT: 0 %
EOS PCT: 2 %
Eosinophils Absolute: 0.1 10*3/uL (ref 0.0–0.7)
HEMATOCRIT: 34.3 % — AB (ref 36.0–46.0)
Hemoglobin: 11.2 g/dL — ABNORMAL LOW (ref 12.0–15.0)
LYMPHS PCT: 31 %
Lymphs Abs: 1.9 10*3/uL (ref 0.7–4.0)
MCH: 26.1 pg (ref 26.0–34.0)
MCHC: 32.7 g/dL (ref 30.0–36.0)
MCV: 80 fL (ref 78.0–100.0)
Monocytes Absolute: 0.8 10*3/uL (ref 0.1–1.0)
Monocytes Relative: 13 %
NEUTROS ABS: 3.3 10*3/uL (ref 1.7–7.7)
Neutrophils Relative %: 54 %
PLATELETS: 215 10*3/uL (ref 150–400)
RBC: 4.29 MIL/uL (ref 3.87–5.11)
RDW: 14.9 % (ref 11.5–15.5)
WBC: 6.1 10*3/uL (ref 4.0–10.5)

## 2017-07-20 LAB — URINALYSIS, ROUTINE W REFLEX MICROSCOPIC
Bilirubin Urine: NEGATIVE
Glucose, UA: NEGATIVE mg/dL
Hgb urine dipstick: NEGATIVE
Ketones, ur: NEGATIVE mg/dL
LEUKOCYTES UA: NEGATIVE
NITRITE: NEGATIVE
PROTEIN: NEGATIVE mg/dL
Specific Gravity, Urine: 1.02 (ref 1.005–1.030)
pH: 7.5 (ref 5.0–8.0)

## 2017-07-20 LAB — COMPREHENSIVE METABOLIC PANEL
ALT: 15 U/L (ref 14–54)
ANION GAP: 7 (ref 5–15)
AST: 17 U/L (ref 15–41)
Albumin: 4 g/dL (ref 3.5–5.0)
Alkaline Phosphatase: 73 U/L (ref 38–126)
BILIRUBIN TOTAL: 0.6 mg/dL (ref 0.3–1.2)
BUN: 11 mg/dL (ref 6–20)
CO2: 23 mmol/L (ref 22–32)
Calcium: 8.8 mg/dL — ABNORMAL LOW (ref 8.9–10.3)
Chloride: 108 mmol/L (ref 101–111)
Creatinine, Ser: 0.71 mg/dL (ref 0.44–1.00)
GFR calc Af Amer: >60 mL/min (ref >60)
Glucose, Bld: 103 mg/dL — ABNORMAL HIGH (ref 65–99)
POTASSIUM: 3.4 mmol/L — AB (ref 3.5–5.1)
Sodium: 138 mmol/L (ref 135–145)
TOTAL PROTEIN: 7.7 g/dL (ref 6.5–8.1)

## 2017-07-20 MED ORDER — DIPHENHYDRAMINE HCL 50 MG/ML IJ SOLN
25.0000 mg | Freq: Once | INTRAMUSCULAR | Status: AC
  Administered 2017-07-20: 25 mg via INTRAVENOUS
  Filled 2017-07-20: qty 1

## 2017-07-20 MED ORDER — SODIUM CHLORIDE 0.9 % IV BOLUS
1000.0000 mL | Freq: Once | INTRAVENOUS | Status: AC
  Administered 2017-07-20: 1000 mL via INTRAVENOUS

## 2017-07-20 MED ORDER — PROCHLORPERAZINE EDISYLATE 10 MG/2ML IJ SOLN
10.0000 mg | Freq: Once | INTRAMUSCULAR | Status: AC
  Administered 2017-07-20: 10 mg via INTRAVENOUS
  Filled 2017-07-20: qty 2

## 2017-07-20 MED ORDER — MAGNESIUM SULFATE 2 GM/50ML IV SOLN
2.0000 g | Freq: Once | INTRAVENOUS | Status: AC
  Administered 2017-07-20: 2 g via INTRAVENOUS
  Filled 2017-07-20: qty 50

## 2017-07-20 NOTE — ED Provider Notes (Signed)
Blood pressure 136/86, pulse 76, temperature 98.8 F (37.1 C), temperature source Oral, resp. rate 18, height 5\' 10"  (1.778 m), weight 105.7 kg (233 lb), last menstrual period 07/12/2017, SpO2 100 %.  Assuming care from Dr. Rex Kras.  In short, Becky Simmons is a 47 y.o. female with a chief complaint of Fatigue .  Refer to the original H&P for additional details.  The current plan of care is to follow up HA symptoms after meds.  04:31 PM Patient is feeling better on reassessment.  She feels ready to discharge home.  I reviewed the patient's lab work which is largely unremarkable. Will f/u with PCP in the coming week.   Nanda Quinton, MD    Margette Fast, MD 07/20/17 [phone]

## 2017-07-20 NOTE — ED Triage Notes (Addendum)
PResents from home where she was getting ready for a colonoscopy and drinkning the prep, but only made it through a small amount of the prep and began feeling weak, fatigued and having a headache. The pt report headache is getting better since last night but she still feels weak and fatigued. Denies numbness and tingling.  The headache began last night at 7 pm and progressively got worse and woke her from sleep. This morning the headache is improving.

## 2017-07-20 NOTE — ED Provider Notes (Signed)
[facility] [place] Provider Note   CSN: [id number] Arrival date & time: 07/20/17  1243     History   Chief Complaint Chief Complaint  Patient presents with  . Fatigue    HPI Becky Simmons is a 47 y.o. female.  47 year old female with past uncle history including hypertension, migraines, type 2 diabetes mellitus who presents with fatigue.  The patient had a colonoscopy scheduled for this morning and yesterday evening she began taking the bowel prep for it.  Shortly after she started the medication, she began feeling generally weak and fatigued.  She also had a gradual onset of headache that eventually became severe.  She took pain medication yesterday for the headache and this morning the headache was somewhat improved but it is still present currently.  She denies any associated numbness/weakness, vision changes, or speech problems.  She canceled her colonoscopy because of her weakness this morning.  She has had no associated vomiting and her fatigue began prior to onset of bowel movements from the bowel prep.  She only took about 40 ounces of the bowel prep.  She denies any urinary symptoms, abdominal pain, chest pain, shortness of breath, fevers, or recent illness.  He does have a history of headaches.  The history is provided by the patient.    Past Medical History:  Diagnosis Date  . Bone spur    cervical  . DDD (degenerative disc disease), cervical   . Hypertension   . Migraine   . Nerve compression   . Type II or unspecified type diabetes mellitus without mention of complication, not stated as uncontrolled     Patient Active Problem List   Diagnosis Date Noted  . Diabetes ([place]) 01/19/2015  . Need for prophylactic vaccination against Streptococcus pneumoniae (pneumococcus) 07/27/2012  . Type II or unspecified type diabetes mellitus without mention of complication, uncontrolled 07/27/2012    Past Surgical History:  Procedure Laterality Date    . BUNIONECTOMY Bilateral   . CESAREAN SECTION  2012  . MYOMECTOMY    . TUBAL LIGATION    . TUBAL LIGATION  02/2010     OB History    Gravida  1   Para      Term      Preterm      AB      Living        SAB      TAB      Ectopic      Multiple      Live Births               Home Medications    Prior to Admission medications   Medication Sig Start Date End Date Taking? Authorizing Provider  carvedilol (COREG) 12.5 MG tablet Take 12.5 mg by mouth 2 (two) times daily with a meal.    [provider]  cyclobenzaprine (FLEXERIL) 10 MG tablet Take 1 tablet (10 mg total) by mouth 2 (two) times daily as needed for muscle spasms. Patient not taking: Reported on 02/27/2017 01/29/16   Frederica Kuster, PA-C  diazepam (VALIUM) 5 MG tablet Take 1 tablet (5 mg total) by mouth 2 (two) times daily. 07/12/17   Deno Etienne, DO  Dulaglutide (TRULICITY) 7.82 NF/6.2ZH SOPN Inject 0.75 mg into the skin once a week.    [provider]  gabapentin (NEURONTIN) 300 MG capsule Take 600 mg by mouth 2 (two) times daily. 06/23/17   [provider]  naproxen (NAPROSYN) 500 MG  tablet Take 1 tablet (500 mg total) by mouth 2 (two) times daily. Patient not taking: Reported on 02/27/2017 01/29/16   Frederica Kuster, PA-C  pravastatin (PRAVACHOL) 10 MG tablet Take 10 mg by mouth daily.    [provider]  sitaGLIPtin (JANUVIA) 100 MG tablet Take 100 mg by mouth daily.    [provider]  timolol (BETIMOL) 0.5 % ophthalmic solution Place 1 drop into the right eye daily.    [provider]  traMADol (ULTRAM) 50 MG tablet Take 1 tablet (50 mg total) by mouth every 6 (six) hours as needed for severe pain. Patient not taking: Reported on 02/27/2017 01/29/16   Frederica Kuster, PA-C    Family History Family History  Problem Relation Age of Onset  . Diabetes Mother   . Hypertension Father   . Cancer Father        Prostate Cancer  . Diabetes Maternal  Grandmother   . Diabetes Paternal Grandmother     Social History Social History   Tobacco Use  . Smoking status: Never Smoker  . Smokeless tobacco: Never Used  Substance Use Topics  . Alcohol use: Yes    Comment: Occasional  . Drug use: No     Allergies   Invokana [canagliflozin] and Metformin and related   Review of Systems Review of Systems All other systems reviewed and are negative except that which was mentioned in HPI   Physical Exam Updated Vital Signs BP (!) 130/93   Pulse 79   Temp 98.8 F (37.1 C) (Oral)   Resp 18   Ht 5\' 10"  (1.778 m)   Wt 105.7 kg (233 lb)   LMP 07/12/2017 (Approximate)   SpO2 100%   BMI 33.43 kg/m   Physical Exam  Constitutional: She is oriented to person, place, and time. She appears well-developed and well-nourished. No distress.  Awake, alert  HENT:  Head: Normocephalic and atraumatic.  Eyes: Pupils are equal, round, and reactive to light. Conjunctivae and EOM are normal.  Neck: Neck supple.  Cardiovascular: Normal rate, regular rhythm and normal heart sounds.  No murmur heard. Pulmonary/Chest: Effort normal and breath sounds normal. No respiratory distress.  Abdominal: Soft. Bowel sounds are normal. She exhibits no distension. There is no tenderness.  Musculoskeletal: She exhibits no edema.  Neurological: She is alert and oriented to person, place, and time. She has normal reflexes. No cranial nerve deficit. She exhibits normal muscle tone.  Fluent speech, normal finger-to-nose testing, negative pronator drift, no clonus 5/5 strength and normal sensation x all 4 extremities  Skin: Skin is warm and dry.  Psychiatric: She has a normal mood and affect. Judgment and thought content normal.  Nursing note and vitals reviewed.    ED Treatments / Results  Labs (all labs ordered are listed, but only abnormal results are displayed) Labs Reviewed  COMPREHENSIVE METABOLIC PANEL - Abnormal; Notable for the following components:       Result Value   Potassium 3.4 (*)    Glucose, Bld 103 (*)    Calcium 8.8 (*)    All other components within normal limits  CBC WITH DIFFERENTIAL/PLATELET - Abnormal; Notable for the following components:   Hemoglobin 11.2 (*)    HCT 34.3 (*)    All other components within normal limits  URINALYSIS, ROUTINE W REFLEX MICROSCOPIC    EKG None  Radiology No results found.  Procedures Procedures (including critical care time)  Medications Ordered in ED Medications  sodium chloride 0.9 % bolus  1,000 mL (1,000 mLs Intravenous New Bag/Given 07/20/17 1504)  magnesium sulfate IVPB 2 g 50 mL (2 g Intravenous New Bag/Given 07/20/17 1508)  diphenhydrAMINE (BENADRYL) injection 25 mg (25 mg Intravenous Given 07/20/17 1504)  prochlorperazine (COMPAZINE) injection 10 mg (10 mg Intravenous Given 07/20/17 1505)     Initial Impression / Assessment and Plan / ED Course  I have reviewed the triage vital signs and the nursing notes.  Pertinent labs & imaging results that were available during my care of the patient were reviewed by me and considered in my medical decision making (see chart for details).     Pt well-appearing on exam with reassuring vital signs, afebrile.  Normal neurologic exam.  No sudden onset of severe headache or red flag symptoms to suggest intracranial process.  Gave migraine cocktail.  Obtained basic labs which show reassuring UA, CBC and CMP.  No obvious cause of her generalized fatigue but no evidence of infection or severe metabolic/electrolyte derangement.  I am signing out to the oncoming provider pending improvement in her headache.  If she is improved on reassessment, I anticipate discharge.  Final Clinical Impressions(s) / ED Diagnoses   Final diagnoses:  None    ED Discharge Orders    None       Chalmers Iddings, Wenda Overland, MD 07/20/17 [phone]

## 2017-07-20 NOTE — Discharge Instructions (Addendum)

## 2017-07-27 DIAGNOSIS — H40002 Preglaucoma, unspecified, left eye: Secondary | ICD-10-CM | POA: Diagnosis not present

## 2017-07-27 DIAGNOSIS — H401112 Primary open-angle glaucoma, right eye, moderate stage: Secondary | ICD-10-CM | POA: Diagnosis not present

## 2017-07-27 MED FILL — TIMOLOL 0.5% EYE DROPS: 0.5 | 90 days supply | Qty: 5 | Fill #0

## 2017-08-01 DIAGNOSIS — M4722 Other spondylosis with radiculopathy, cervical region: Secondary | ICD-10-CM | POA: Diagnosis not present

## 2017-08-01 DIAGNOSIS — M50122 Cervical disc disorder at C5-C6 level with radiculopathy: Secondary | ICD-10-CM | POA: Diagnosis not present

## 2017-08-01 DIAGNOSIS — M50123 Cervical disc disorder at C6-C7 level with radiculopathy: Secondary | ICD-10-CM | POA: Diagnosis not present

## 2017-08-01 DIAGNOSIS — M542 Cervicalgia: Secondary | ICD-10-CM | POA: Diagnosis not present

## 2017-08-01 DIAGNOSIS — M4802 Spinal stenosis, cervical region: Secondary | ICD-10-CM | POA: Diagnosis not present

## 2017-08-02 MED FILL — CARVEDILOL 12.5 MG TABLET: 12.5 | 30 days supply | Qty: 60 | Fill #0

## 2017-08-03 DIAGNOSIS — M4802 Spinal stenosis, cervical region: Secondary | ICD-10-CM | POA: Diagnosis not present

## 2017-08-03 DIAGNOSIS — M50123 Cervical disc disorder at C6-C7 level with radiculopathy: Secondary | ICD-10-CM | POA: Diagnosis not present

## 2017-08-03 DIAGNOSIS — Z01818 Encounter for other preprocedural examination: Secondary | ICD-10-CM | POA: Diagnosis not present

## 2017-08-03 DIAGNOSIS — M4722 Other spondylosis with radiculopathy, cervical region: Secondary | ICD-10-CM | POA: Diagnosis not present

## 2017-08-03 DIAGNOSIS — M50122 Cervical disc disorder at C5-C6 level with radiculopathy: Secondary | ICD-10-CM | POA: Diagnosis not present

## 2017-08-07 DIAGNOSIS — E1165 Type 2 diabetes mellitus with hyperglycemia: Secondary | ICD-10-CM | POA: Diagnosis not present

## 2017-08-07 DIAGNOSIS — E782 Mixed hyperlipidemia: Secondary | ICD-10-CM | POA: Diagnosis not present

## 2017-08-08 DIAGNOSIS — M5412 Radiculopathy, cervical region: Secondary | ICD-10-CM | POA: Diagnosis not present

## 2017-08-08 DIAGNOSIS — E782 Mixed hyperlipidemia: Secondary | ICD-10-CM | POA: Diagnosis not present

## 2017-08-08 DIAGNOSIS — E669 Obesity, unspecified: Secondary | ICD-10-CM | POA: Diagnosis not present

## 2017-08-08 DIAGNOSIS — M4802 Spinal stenosis, cervical region: Secondary | ICD-10-CM | POA: Diagnosis not present

## 2017-08-08 DIAGNOSIS — M50122 Cervical disc disorder at C5-C6 level with radiculopathy: Secondary | ICD-10-CM | POA: Diagnosis not present

## 2017-08-08 DIAGNOSIS — M50123 Cervical disc disorder at C6-C7 level with radiculopathy: Secondary | ICD-10-CM | POA: Diagnosis not present

## 2017-08-08 DIAGNOSIS — M4722 Other spondylosis with radiculopathy, cervical region: Secondary | ICD-10-CM | POA: Diagnosis not present

## 2017-08-08 DIAGNOSIS — M50222 Other cervical disc displacement at C5-C6 level: Secondary | ICD-10-CM | POA: Diagnosis not present

## 2017-08-08 DIAGNOSIS — D500 Iron deficiency anemia secondary to blood loss (chronic): Secondary | ICD-10-CM | POA: Diagnosis not present

## 2017-08-08 DIAGNOSIS — M4322 Fusion of spine, cervical region: Secondary | ICD-10-CM | POA: Diagnosis not present

## 2017-08-08 DIAGNOSIS — E1165 Type 2 diabetes mellitus with hyperglycemia: Secondary | ICD-10-CM | POA: Diagnosis not present

## 2017-08-09 DIAGNOSIS — M4322 Fusion of spine, cervical region: Secondary | ICD-10-CM | POA: Diagnosis not present

## 2017-08-09 DIAGNOSIS — M50123 Cervical disc disorder at C6-C7 level with radiculopathy: Secondary | ICD-10-CM | POA: Diagnosis not present

## 2017-08-09 DIAGNOSIS — M4802 Spinal stenosis, cervical region: Secondary | ICD-10-CM | POA: Diagnosis not present

## 2017-08-09 MED FILL — JANUVIA 100 MG TABLET: 100 | 30 days supply | Qty: 30 | Fill #3

## 2017-08-09 MED FILL — GABAPENTIN 300 MG CAPSULE: 300 | 20 days supply | Qty: 120 | Fill #1

## 2017-08-09 MED FILL — METHOCARBAMOL 750 MG TABS: 750 | 30 days supply | Qty: 120 | Fill #0

## 2017-08-09 MED FILL — HYDROCODON-APAP 5-325: 5-325 | 7 days supply | Qty: 42 | Fill #0

## 2017-08-09 MED FILL — TRULICITY 1.5 MG/0.5 ML PEN: 1.5 | 28 days supply | Qty: 2 | Fill #9

## 2017-08-28 MED FILL — HYDROCODON-APAP 5-325: 5-325 | 20 days supply | Qty: 120 | Fill #0

## 2017-09-05 MED FILL — PRAVASTATIN NA 40 MG TAB: 40 | 90 days supply | Qty: 90 | Fill #2

## 2017-09-05 MED FILL — TRULICITY 1.5 MG/0.5 ML PEN: 1.5 | 28 days supply | Qty: 2 | Fill #10

## 2017-09-05 MED FILL — GABAPENTIN 300 MG CAPSULE: 300 | 20 days supply | Qty: 120 | Fill #2

## 2017-09-05 MED FILL — CARVEDILOL 12.5 MG TABLET: 12.5 | 30 days supply | Qty: 60 | Fill #1

## 2017-09-05 MED FILL — JANUVIA 100 MG TABLET: 100 | 30 days supply | Qty: 30 | Fill #4

## 2017-09-14 DIAGNOSIS — M50123 Cervical disc disorder at C6-C7 level with radiculopathy: Secondary | ICD-10-CM | POA: Diagnosis not present

## 2017-09-14 DIAGNOSIS — M50122 Cervical disc disorder at C5-C6 level with radiculopathy: Secondary | ICD-10-CM | POA: Diagnosis not present

## 2017-10-04 ENCOUNTER — Ambulatory Visit (INDEPENDENT_AMBULATORY_CARE_PROVIDER_SITE_OTHER): Payer: Self-pay | Admitting: Nurse Practitioner

## 2017-10-04 VITALS — BP 115/70 | HR 72 | Temp 98.5°F | Resp 16 | Wt 231.0 lb

## 2017-10-04 DIAGNOSIS — R300 Dysuria: Secondary | ICD-10-CM

## 2017-10-04 DIAGNOSIS — N3000 Acute cystitis without hematuria: Secondary | ICD-10-CM

## 2017-10-04 LAB — POCT URINALYSIS DIPSTICK
Bilirubin, UA: NEGATIVE
Glucose, UA: NEGATIVE
KETONES UA: NEGATIVE
LEUKOCYTES UA: NEGATIVE
NITRITE UA: NEGATIVE
Protein, UA: POSITIVE — AB
SPEC GRAV UA: 1.01 (ref 1.010–1.025)
Urobilinogen, UA: 0.2 E.U./dL
pH, UA: 5 (ref 5.0–8.0)

## 2017-10-04 MED ORDER — PHENAZOPYRIDINE HCL 100 MG PO TABS: 100.0000 mg | ORAL_TABLET | Freq: Three times a day (TID) | ORAL | 0 refills | Status: AC | PRN

## 2017-10-04 MED ORDER — NITROFURANTOIN MONOHYD MACRO 100 MG PO CAPS: 100.0000 mg | ORAL_CAPSULE | Freq: Two times a day (BID) | ORAL | 0 refills | Status: AC

## 2017-10-04 MED ORDER — FLUCONAZOLE 150 MG PO TABS: 150.0000 mg | ORAL_TABLET | Freq: Once | ORAL | 0 refills | Status: AC

## 2017-10-04 MED FILL — PHENAZOPYRIDINE 100 MG TAB: 100 | 4 days supply | Qty: 10 | Fill #0

## 2017-10-04 MED FILL — NITROFURANTOIN MONO-MCR 100: 100 | 5 days supply | Qty: 10 | Fill #0

## 2017-10-04 MED FILL — FLUCONAZOLE 150 MG TABS: 150 | 9 days supply | Qty: 3 | Fill #0

## 2017-10-04 NOTE — Progress Notes (Signed)
Subjective:    Becky Simmons is a 47 y.o. female who complains of burning with urination, frequency, nausea, suprapubic pressure and urgency for 6 days.  Patient denies back pain, fever, sorethroat, stomach ache, vaginal discharge and hematuria.  Patient does not have a history of recurrent UTI.  Patient does not have a history of pyelonephritis.  She does have a history of type 2 diabetes.  The patient states her last A1c was 7.5.   The following portions of the patient's history were reviewed and updated as appropriate: allergies, current medications and past medical history. Review of Systems Constitutional: negative Eyes: negative Ears, nose, mouth, throat, and face: negative Respiratory: negative Cardiovascular: negative Gastrointestinal: positive for nausea, negative for abdominal pain, diarrhea, nausea and vomiting Genitourinary:positive for dysuria, frequency and See HPI, negative for hematuria, nocturia, urinary incontinence and See HPI    Objective:    There were no vitals taken for this visit. General: alert, cooperative and no distress  Abdomen: soft, non-tender, without masses or organomegaly in the entire abdomen  Back: back muscles are full ROM, CVA tenderness absent  GU: defer exam   Laboratory:  Urine dipstick shows negative for all components.   Micro exam: not done.    Assessment:    Acute cystitis    Plan: Plan:   Exam findings, diagnosis etiology and medication use and indications reviewed with patient. Follow- Up and discharge instructions provided. No emergent/urgent issues found on exam.  Based on the patient's clinical presentation and symptomology.  I will treat this urinary tract infection despite a negative urinalysis.  Instructed patient that if her symptoms do not improve she will need to follow-up with her PCP in order to have a urinary culture performed.  Further discussed causes of UTIs along with preventative measures.  Patient education was  provided. Patient verbalized understanding of information provided and agrees with plan of care (POC), all questions answered.  1. Dysuria  - POCT urinalysis dipstick -Take medications as prescribed. -Ibuprofen or Tylenol for pain, fever, or general discomfort. -Follow up with PCP if your symptoms do not improve. -Go to the ER if you develop increasing fever, abdominal pain or other concerns.  2. Acute cystitis without hematuria   -Take medications as prescribed. -Ibuprofen or Tylenol for pain, fever, or general discomfort. -Follow up with PCP if your symptoms do not improve. -Go to the ER if you develop increasing fever, abdominal pain or other concerns. - nitrofurantoin, macrocrystal-monohydrate, (MACROBID) 100 MG capsule; Take 1 capsule (100 mg total) by mouth 2 (two) times daily for 5 days.  Dispense: 10 capsule; Refill: 0 - phenazopyridine (PYRIDIUM) 100 MG tablet; Take 1 tablet (100 mg total) by mouth 3 (three) times daily as needed for up to 3 days for pain.  Dispense: 10 tablet; Refill: 0

## 2017-10-04 NOTE — Patient Instructions (Signed)
Urinary Tract Infection, Adult -Take medications as prescribed. -Ibuprofen or Tylenol for pain, fever, or general discomfort. -Follow up with PCP if your symptoms do not improve. -Go to the ER if you develop increasing fever, abdominal pain or other concerns.  A urinary tract infection (UTI) is an infection of any part of the urinary tract, which includes the kidneys, ureters, bladder, and urethra. These organs make, store, and get rid of urine in the body. UTI can be a bladder infection (cystitis) or kidney infection (pyelonephritis). What are the causes? This infection may be caused by fungi, viruses, or bacteria. Bacteria are the most common cause of UTIs. This condition can also be caused by repeated incomplete emptying of the bladder during urination. What increases the risk? This condition is more likely to develop if:  You ignore your need to urinate or hold urine for long periods of time.  You do not empty your bladder completely during urination.  You wipe back to front after urinating or having a bowel movement, if you are female.  You are uncircumcised, if you are female.  You are constipated.  You have a urinary catheter that stays in place (indwelling).  You have a weak defense (immune) system.  You have a medical condition that affects your bowels, kidneys, or bladder.  You have diabetes.  You take antibiotic medicines frequently or for long periods of time, and the antibiotics no longer work well against certain types of infections (antibiotic resistance).  You take medicines that irritate your urinary tract.  You are exposed to chemicals that irritate your urinary tract.  You are female.  What are the signs or symptoms? Symptoms of this condition include:  Fever.  Frequent urination or passing small amounts of urine frequently.  Needing to urinate urgently.  Pain or burning with urination.  Urine that smells bad or unusual.  Cloudy urine.  Pain in  the lower abdomen or back.  Trouble urinating.  Blood in the urine.  Vomiting or being less hungry than normal.  Diarrhea or abdominal pain.  Vaginal discharge, if you are female.  How is this diagnosed? This condition is diagnosed with a medical history and physical exam. You will also need to provide a urine sample to test your urine. Other tests may be done, including:  Blood tests.  Sexually transmitted disease (STD) testing.  If you have had more than one UTI, a cystoscopy or imaging studies may be done to determine the cause of the infections. How is this treated? Treatment for this condition often includes a combination of two or more of the following:  Antibiotic medicine.  Other medicines to treat less common causes of UTI.  Over-the-counter medicines to treat pain.  Drinking enough water to stay hydrated.  Follow these instructions at home:  Take over-the-counter and prescription medicines only as told by your health care provider.  If you were prescribed an antibiotic, take it as told by your health care provider. Do not stop taking the antibiotic even if you start to feel better.  Avoid alcohol, caffeine, tea, and carbonated beverages. They can irritate your bladder.  Drink enough fluid to keep your urine clear or pale yellow.  Keep all follow-up visits as told by your health care provider. This is important.  Make sure to: ? Empty your bladder often and completely. Do not hold urine for long periods of time. ? Empty your bladder before and after sex. ? Wipe from front to back after a bowel movement if  you are female. Use each tissue one time when you wipe. Contact a health care provider if:  You have back pain.  You have a fever.  You feel nauseous or vomit.  Your symptoms do not get better after 3 days.  Your symptoms go away and then return. Get help right away if:  You have severe back pain or lower abdominal pain.  You are vomiting and  cannot keep down any medicines or water. This information is not intended to replace advice given to you by your health care provider. Make sure you discuss any questions you have with your health care provider. Document Released: 11/17/2004 Document Revised: 07/22/2015 Document Reviewed: 12/29/2014 Elsevier Interactive Patient Education  [employer].

## 2017-10-05 MED FILL — METHOCARBAMOL 750 MG TABS: 750 | 30 days supply | Qty: 120 | Fill #0

## 2017-10-09 MED FILL — CARVEDILOL 12.5 MG TABLET: 12.5 | 30 days supply | Qty: 60 | Fill #2

## 2017-10-11 MED FILL — TRULICITY 1.5 MG/0.5 ML PEN: 1.5 | 28 days supply | Qty: 2 | Fill #11

## 2017-10-12 MED FILL — ACCU-CHEK GUIDE TEST STRIP: 50 days supply | Qty: 100 | Fill #1

## 2017-10-16 MED FILL — JANUVIA 100 MG TABLET: 100 | 30 days supply | Qty: 30 | Fill #5

## 2017-10-28 DIAGNOSIS — N3001 Acute cystitis with hematuria: Secondary | ICD-10-CM | POA: Diagnosis not present

## 2017-10-28 DIAGNOSIS — R300 Dysuria: Secondary | ICD-10-CM | POA: Diagnosis not present

## 2017-10-28 DIAGNOSIS — R399 Unspecified symptoms and signs involving the genitourinary system: Secondary | ICD-10-CM | POA: Diagnosis not present

## 2017-11-02 MED FILL — TRULICITY 1.5 MG/0.5 ML PEN: 1.5 | 28 days supply | Qty: 2 | Fill #0

## 2017-11-06 DIAGNOSIS — E782 Mixed hyperlipidemia: Secondary | ICD-10-CM | POA: Diagnosis not present

## 2017-11-06 DIAGNOSIS — I10 Essential (primary) hypertension: Secondary | ICD-10-CM | POA: Diagnosis not present

## 2017-11-06 DIAGNOSIS — E1165 Type 2 diabetes mellitus with hyperglycemia: Secondary | ICD-10-CM | POA: Diagnosis not present

## 2017-11-07 DIAGNOSIS — M4322 Fusion of spine, cervical region: Secondary | ICD-10-CM | POA: Diagnosis not present

## 2017-11-08 DIAGNOSIS — E1165 Type 2 diabetes mellitus with hyperglycemia: Secondary | ICD-10-CM | POA: Diagnosis not present

## 2017-11-08 DIAGNOSIS — E669 Obesity, unspecified: Secondary | ICD-10-CM | POA: Diagnosis not present

## 2017-11-08 DIAGNOSIS — R350 Frequency of micturition: Secondary | ICD-10-CM | POA: Diagnosis not present

## 2017-11-08 DIAGNOSIS — R5383 Other fatigue: Secondary | ICD-10-CM | POA: Diagnosis not present

## 2017-11-08 DIAGNOSIS — I10 Essential (primary) hypertension: Secondary | ICD-10-CM | POA: Diagnosis not present

## 2017-11-08 DIAGNOSIS — R42 Dizziness and giddiness: Secondary | ICD-10-CM | POA: Diagnosis not present

## 2017-11-08 DIAGNOSIS — E782 Mixed hyperlipidemia: Secondary | ICD-10-CM | POA: Diagnosis not present

## 2017-11-08 DIAGNOSIS — D500 Iron deficiency anemia secondary to blood loss (chronic): Secondary | ICD-10-CM | POA: Diagnosis not present

## 2017-11-08 DIAGNOSIS — D251 Intramural leiomyoma of uterus: Secondary | ICD-10-CM | POA: Diagnosis not present

## 2017-11-08 MED FILL — FERROCITE TABLET: 324 | 30 days supply | Qty: 30 | Fill #0

## 2017-11-13 DIAGNOSIS — D500 Iron deficiency anemia secondary to blood loss (chronic): Secondary | ICD-10-CM | POA: Diagnosis not present

## 2017-11-14 MED FILL — GABAPENTIN 300 MG CAPSULE: 300 | 20 days supply | Qty: 120 | Fill #0

## 2017-11-14 MED FILL — JANUVIA 100 MG TABLET: 100 | 30 days supply | Qty: 30 | Fill #0

## 2017-12-06 DIAGNOSIS — I10 Essential (primary) hypertension: Secondary | ICD-10-CM | POA: Diagnosis not present

## 2017-12-06 DIAGNOSIS — D500 Iron deficiency anemia secondary to blood loss (chronic): Secondary | ICD-10-CM | POA: Diagnosis not present

## 2017-12-06 DIAGNOSIS — N8501 Benign endometrial hyperplasia: Secondary | ICD-10-CM | POA: Diagnosis not present

## 2017-12-06 DIAGNOSIS — E782 Mixed hyperlipidemia: Secondary | ICD-10-CM | POA: Diagnosis not present

## 2017-12-06 MED FILL — TRULICITY 1.5 MG/0.5 ML PEN: 1.5 | 28 days supply | Qty: 2 | Fill #1

## 2017-12-06 MED FILL — PRAVASTATIN NA 40 MG TAB: 40 | 90 days supply | Qty: 90 | Fill #3

## 2017-12-07 DIAGNOSIS — N8501 Benign endometrial hyperplasia: Secondary | ICD-10-CM | POA: Diagnosis not present

## 2017-12-07 DIAGNOSIS — N920 Excessive and frequent menstruation with regular cycle: Secondary | ICD-10-CM | POA: Diagnosis not present

## 2017-12-07 DIAGNOSIS — D251 Intramural leiomyoma of uterus: Secondary | ICD-10-CM | POA: Diagnosis not present

## 2017-12-07 DIAGNOSIS — N852 Hypertrophy of uterus: Secondary | ICD-10-CM | POA: Diagnosis not present

## 2017-12-07 DIAGNOSIS — Z01818 Encounter for other preprocedural examination: Secondary | ICD-10-CM | POA: Diagnosis not present

## 2017-12-08 DIAGNOSIS — R9431 Abnormal electrocardiogram [ECG] [EKG]: Secondary | ICD-10-CM | POA: Diagnosis not present

## 2017-12-08 DIAGNOSIS — Z01818 Encounter for other preprocedural examination: Secondary | ICD-10-CM | POA: Diagnosis not present

## 2017-12-11 DIAGNOSIS — R300 Dysuria: Secondary | ICD-10-CM | POA: Diagnosis not present

## 2017-12-11 MED FILL — CIPROFLOXACIN HCL 250 MG TA: 250 | 5 days supply | Qty: 10 | Fill #0

## 2017-12-13 DIAGNOSIS — D500 Iron deficiency anemia secondary to blood loss (chronic): Secondary | ICD-10-CM | POA: Diagnosis not present

## 2017-12-13 DIAGNOSIS — D251 Intramural leiomyoma of uterus: Secondary | ICD-10-CM | POA: Diagnosis not present

## 2017-12-13 DIAGNOSIS — Z7984 Long term (current) use of oral hypoglycemic drugs: Secondary | ICD-10-CM | POA: Diagnosis not present

## 2017-12-13 DIAGNOSIS — N859 Noninflammatory disorder of uterus, unspecified: Secondary | ICD-10-CM | POA: Diagnosis not present

## 2017-12-13 DIAGNOSIS — E119 Type 2 diabetes mellitus without complications: Secondary | ICD-10-CM | POA: Diagnosis not present

## 2017-12-13 DIAGNOSIS — M549 Dorsalgia, unspecified: Secondary | ICD-10-CM | POA: Diagnosis not present

## 2017-12-13 DIAGNOSIS — E785 Hyperlipidemia, unspecified: Secondary | ICD-10-CM | POA: Diagnosis not present

## 2017-12-13 DIAGNOSIS — R51 Headache: Secondary | ICD-10-CM | POA: Diagnosis not present

## 2017-12-13 DIAGNOSIS — I10 Essential (primary) hypertension: Secondary | ICD-10-CM | POA: Diagnosis not present

## 2017-12-13 DIAGNOSIS — N920 Excessive and frequent menstruation with regular cycle: Secondary | ICD-10-CM | POA: Diagnosis not present

## 2017-12-13 MED FILL — IBUPROFEN 600 MG TABLET: 600 | 5 days supply | Qty: 20 | Fill #0

## 2017-12-13 MED FILL — DOK 100 MG SOFTGEL: 100 | 50 days supply | Qty: 100 | Fill #0

## 2017-12-13 MED FILL — HYDROCODON-APAP 5-325: 5-325 | 2 days supply | Qty: 10 | Fill #0

## 2017-12-15 DIAGNOSIS — M6281 Muscle weakness (generalized): Secondary | ICD-10-CM | POA: Diagnosis not present

## 2017-12-15 DIAGNOSIS — R1110 Vomiting, unspecified: Secondary | ICD-10-CM | POA: Diagnosis not present

## 2017-12-15 DIAGNOSIS — J0120 Acute ethmoidal sinusitis, unspecified: Secondary | ICD-10-CM | POA: Diagnosis not present

## 2017-12-15 DIAGNOSIS — R51 Headache: Secondary | ICD-10-CM | POA: Diagnosis not present

## 2017-12-15 DIAGNOSIS — R509 Fever, unspecified: Secondary | ICD-10-CM | POA: Diagnosis not present

## 2017-12-15 DIAGNOSIS — R61 Generalized hyperhidrosis: Secondary | ICD-10-CM | POA: Diagnosis not present

## 2017-12-15 DIAGNOSIS — R42 Dizziness and giddiness: Secondary | ICD-10-CM | POA: Diagnosis not present

## 2017-12-15 DIAGNOSIS — G43009 Migraine without aura, not intractable, without status migrainosus: Secondary | ICD-10-CM | POA: Diagnosis not present

## 2017-12-16 DIAGNOSIS — M6281 Muscle weakness (generalized): Secondary | ICD-10-CM | POA: Diagnosis not present

## 2017-12-16 DIAGNOSIS — R509 Fever, unspecified: Secondary | ICD-10-CM | POA: Diagnosis not present

## 2017-12-16 DIAGNOSIS — J0120 Acute ethmoidal sinusitis, unspecified: Secondary | ICD-10-CM | POA: Diagnosis not present

## 2017-12-16 DIAGNOSIS — R1110 Vomiting, unspecified: Secondary | ICD-10-CM | POA: Diagnosis not present

## 2017-12-16 DIAGNOSIS — R61 Generalized hyperhidrosis: Secondary | ICD-10-CM | POA: Diagnosis not present

## 2017-12-16 DIAGNOSIS — G43009 Migraine without aura, not intractable, without status migrainosus: Secondary | ICD-10-CM | POA: Diagnosis not present

## 2017-12-20 DIAGNOSIS — D500 Iron deficiency anemia secondary to blood loss (chronic): Secondary | ICD-10-CM | POA: Diagnosis not present

## 2017-12-20 MED FILL — JANUVIA 100 MG TABLET: 100 | 30 days supply | Qty: 30 | Fill #1

## 2017-12-21 DIAGNOSIS — H401112 Primary open-angle glaucoma, right eye, moderate stage: Secondary | ICD-10-CM | POA: Diagnosis not present

## 2017-12-21 DIAGNOSIS — H40002 Preglaucoma, unspecified, left eye: Secondary | ICD-10-CM | POA: Diagnosis not present

## 2018-01-03 DIAGNOSIS — Z9889 Other specified postprocedural states: Secondary | ICD-10-CM | POA: Diagnosis not present

## 2018-01-03 DIAGNOSIS — D500 Iron deficiency anemia secondary to blood loss (chronic): Secondary | ICD-10-CM | POA: Diagnosis not present

## 2018-01-03 DIAGNOSIS — Z09 Encounter for follow-up examination after completed treatment for conditions other than malignant neoplasm: Secondary | ICD-10-CM | POA: Diagnosis not present

## 2018-01-08 DIAGNOSIS — D500 Iron deficiency anemia secondary to blood loss (chronic): Secondary | ICD-10-CM | POA: Diagnosis not present

## 2018-01-08 DIAGNOSIS — N920 Excessive and frequent menstruation with regular cycle: Secondary | ICD-10-CM | POA: Diagnosis not present

## 2018-01-08 DIAGNOSIS — Z9889 Other specified postprocedural states: Secondary | ICD-10-CM | POA: Diagnosis not present

## 2018-01-08 MED FILL — TRULICITY 1.5 MG/0.5 ML PEN: 1.5 | 28 days supply | Qty: 2 | Fill #2

## 2018-01-12 DIAGNOSIS — D500 Iron deficiency anemia secondary to blood loss (chronic): Secondary | ICD-10-CM | POA: Diagnosis not present

## 2018-01-16 MED FILL — JANUVIA 100 MG TABLET: 100 | 30 days supply | Qty: 30 | Fill #2

## 2018-01-26 DIAGNOSIS — D500 Iron deficiency anemia secondary to blood loss (chronic): Secondary | ICD-10-CM | POA: Diagnosis not present

## 2018-02-05 MED FILL — TRULICITY 1.5 MG/0.5 ML PEN: 1.5 | 28 days supply | Qty: 2 | Fill #3

## 2018-02-09 MED FILL — JANUVIA 100 MG TABLET: 100 | 30 days supply | Qty: 30 | Fill #3

## 2018-02-12 MED FILL — OFLOXACIN 0.3% EAR DROPS: 0.3 | 5 days supply | Qty: 5 | Fill #0

## 2018-02-18 ENCOUNTER — Encounter: Payer: Self-pay | Admitting: Family Medicine

## 2018-02-18 ENCOUNTER — Ambulatory Visit (INDEPENDENT_AMBULATORY_CARE_PROVIDER_SITE_OTHER): Payer: Self-pay | Admitting: Family Medicine

## 2018-02-18 VITALS — BP 110/75 | HR 82 | Temp 98.7°F | Resp 16 | Wt 221.2 lb

## 2018-02-18 DIAGNOSIS — S09301A Unspecified injury of right middle and inner ear, initial encounter: Secondary | ICD-10-CM

## 2018-02-18 DIAGNOSIS — H938X1 Other specified disorders of right ear: Secondary | ICD-10-CM

## 2018-02-18 NOTE — Patient Instructions (Signed)

## 2018-02-18 NOTE — Progress Notes (Signed)
Becky Simmons is a 47 y.o. female who presents today with 6 days of ear fullness after traumatic insertion of q tip into ear that caused pain. She sought care with provider via phone who provided ear drops. She reports the pain decreased and transitioned into fullness. She denies any drainage, dizziness, nausea or vomiting.  She denies any hearing loss but reports decreased hearing, she denies tinnitus.She denies any blood from her ear and does not feel that she left the qtip in place. She does have a PCP who is caring for her chronic health conditions of HTN and HLD (see full list below) she denies any unstable chronic health conditions.  Review of Systems  Constitutional: Negative for chills, fever and malaise/fatigue.  HENT: Positive for ear pain and hearing loss. Negative for congestion, ear discharge, sinus pain, sore throat and tinnitus.   Eyes: Negative.   Respiratory: Negative for cough, sputum production and shortness of breath.   Cardiovascular: Negative.  Negative for chest pain.  Gastrointestinal: Negative for abdominal pain, diarrhea, nausea and vomiting.  Genitourinary: Negative for dysuria, frequency, hematuria and urgency.  Musculoskeletal: Negative for myalgias.  Skin: Negative.   Neurological: Negative for headaches.  Endo/Heme/Allergies: Negative.   Psychiatric/Behavioral: Negative.     Becky Simmons has a current medication list which includes the following prescription(s): dulaglutide, gabapentin, pravastatin, sitagliptin, timolol, carvedilol, cyclobenzaprine, diazepam, naproxen, and tramadol. Also is allergic to invokana [canagliflozin] and metformin and related.  Becky Simmons  has a past medical history of Bone spur, DDD (degenerative disc disease), cervical, Hypertension, Migraine, Nerve compression, and Type II or unspecified type diabetes mellitus without mention of complication, not stated as uncontrolled. Also  has a past surgical history that includes Cesarean section  (2012); Myomectomy; Tubal ligation; Tubal ligation (02/2010); and Bunionectomy (Bilateral).    O: Vitals:   02/18/18 1020  BP: 110/75  Pulse: 82  Resp: 16  Temp: 98.7 F (37.1 C)  SpO2: 98%    Physical Exam  Nursing note and vitals reviewed. Constitutional: She is oriented to person, place, and time. She appears well-developed and well-nourished. She is active. She does not appear ill. No distress.  HENT:  Head: Normocephalic and atraumatic.  Right Ear: Hearing, external ear and ear canal normal.  Left Ear: Hearing, tympanic membrane, external ear and ear canal normal.  Nose: Nose normal.  Mouth/Throat: Uvula is midline and oropharynx is clear and moist. No oropharyngeal exudate.  Right ear is filled with creamy chunky substance that cannot be identified and only cerumen. She has stuck a q-tip in the ear- no evidence of this on exam. No evidence of blood or foreign body. There is not evidence of drainage from the ear.  Eyes: Pupils are equal, round, and reactive to light. Conjunctivae and EOM are normal. Right eye exhibits normal extraocular motion and no nystagmus. Left eye exhibits normal extraocular motion and no nystagmus.  Neck: Normal range of motion. No tracheal deviation present. No thyromegaly present.  Cardiovascular: Normal rate, regular rhythm and normal heart sounds.  Respiratory: Effort normal and breath sounds normal. No respiratory distress. She has no wheezes. She has no rales.  GI: Soft. Bowel sounds are normal. She exhibits no distension. There is no abdominal tenderness. There is no rebound and no guarding.  Musculoskeletal: Normal range of motion.  Lymphadenopathy:    She has no cervical adenopathy.  Neurological: She is alert and oriented to person, place, and time. She has normal strength. No cranial nerve deficit or sensory deficit.  CN intact,  along with normal gait and strength and sensation intact.  Skin: Skin is warm and dry. No rash noted. She is not  diaphoretic. No erythema.  Psychiatric: She has a normal mood and affect. Her behavior is normal.   A: 1. Ear fullness, right   2. Eardrum trauma, right, initial encounter    P: 1. Ear fullness, right Post traumatic insertion of qtip 6 days ago. 2. Eardrum trauma, right, initial encounter Patient has substance in right ear that is affected- does not appear to be only way as patient reports relief after initiation of antibiotic drops prescribed via phone visit a few days after the initial trauma. She now reports decreased hearing and a fullness to her ear. Advised to seek care with her PCP for ED/Urgent care- discussed risk of introducing fluid in ear with known trauma and unknown if TM is intact to not.    Discussed with patient exam findings, suspected diagnosis etiology and  reviewed recommended treatment plan and follow up, including complications and indications for urgent medical follow up and evaluation. Medications including use and indications reviewed with patient. Patient provided relevant patient education on diagnosis and/or relevant related condition that were discussed and reviewed with patient at discharge. Patient verbalized understanding of information provided and agrees with plan of care (POC), all questions answered.

## 2018-02-19 DIAGNOSIS — H6121 Impacted cerumen, right ear: Secondary | ICD-10-CM | POA: Diagnosis not present

## 2018-02-27 DIAGNOSIS — D500 Iron deficiency anemia secondary to blood loss (chronic): Secondary | ICD-10-CM | POA: Diagnosis not present

## 2018-02-27 DIAGNOSIS — Z130 Encounter for screening for diseases of the blood and blood-forming organs and certain disorders involving the immune mechanism: Secondary | ICD-10-CM | POA: Diagnosis not present

## 2018-02-27 DIAGNOSIS — D649 Anemia, unspecified: Secondary | ICD-10-CM | POA: Diagnosis not present

## 2018-02-28 DIAGNOSIS — H401212 Low-tension glaucoma, right eye, moderate stage: Secondary | ICD-10-CM | POA: Diagnosis not present

## 2018-02-28 DIAGNOSIS — E119 Type 2 diabetes mellitus without complications: Secondary | ICD-10-CM | POA: Diagnosis not present

## 2018-03-02 MED FILL — PRAVASTATIN NA 40 MG TAB: 40 | 30 days supply | Qty: 30 | Fill #0

## 2018-03-02 MED FILL — TRULICITY 1.5 MG/0.5 ML PEN: 1.5 | 28 days supply | Qty: 2 | Fill #4

## 2018-03-07 MED FILL — GABAPENTIN 300 MG CAPSULE: 300 | 20 days supply | Qty: 120 | Fill #1

## 2018-03-14 MED FILL — JANUVIA 100 MG TABLET: 100 | 30 days supply | Qty: 30 | Fill #4

## 2018-04-05 MED FILL — PRAVASTATIN NA 40 MG TAB: 40 | 90 days supply | Qty: 90 | Fill #1

## 2018-04-05 MED FILL — TRULICITY 1.5 MG/0.5 ML PEN: 1.5 | 28 days supply | Qty: 2 | Fill #5

## 2018-04-11 DIAGNOSIS — D500 Iron deficiency anemia secondary to blood loss (chronic): Secondary | ICD-10-CM | POA: Diagnosis not present

## 2018-04-16 DIAGNOSIS — Z9889 Other specified postprocedural states: Secondary | ICD-10-CM | POA: Diagnosis not present

## 2018-04-16 DIAGNOSIS — Z01411 Encounter for gynecological examination (general) (routine) with abnormal findings: Secondary | ICD-10-CM | POA: Diagnosis not present

## 2018-04-16 DIAGNOSIS — D251 Intramural leiomyoma of uterus: Secondary | ICD-10-CM | POA: Diagnosis not present

## 2018-04-16 DIAGNOSIS — N852 Hypertrophy of uterus: Secondary | ICD-10-CM | POA: Diagnosis not present

## 2018-04-19 MED FILL — JANUVIA 100 MG TABLET: 100 | 30 days supply | Qty: 30 | Fill #5 | Status: TO

## 2018-04-20 MED FILL — ACCU-CHEK GUIDE TEST STRIP: 50 days supply | Qty: 100 | Fill #2

## 2018-04-25 DIAGNOSIS — E782 Mixed hyperlipidemia: Secondary | ICD-10-CM | POA: Diagnosis not present

## 2018-04-25 DIAGNOSIS — E1165 Type 2 diabetes mellitus with hyperglycemia: Secondary | ICD-10-CM | POA: Diagnosis not present

## 2018-04-25 DIAGNOSIS — E669 Obesity, unspecified: Secondary | ICD-10-CM | POA: Diagnosis not present

## 2018-04-25 DIAGNOSIS — I10 Essential (primary) hypertension: Secondary | ICD-10-CM | POA: Diagnosis not present

## 2018-05-07 MED FILL — TRULICITY 1.5 MG/0.5 ML PEN: 1.5 | 28 days supply | Qty: 2 | Fill #6 | Status: TO

## 2018-05-14 MED FILL — TIMOLOL 0.5% EYE DROPS: 0.5 | 90 days supply | Qty: 5 | Fill #0

## 2018-05-21 MED FILL — JANUVIA 100 MG TABLET: 100 | 90 days supply | Qty: 90 | Fill #0

## 2018-05-30 MED FILL — TRULICITY 1.5 MG/0.5 ML PEN: 1.5 | 28 days supply | Qty: 2 | Fill #0 | Status: TO

## 2018-07-02 MED FILL — TRULICITY 1.5 MG/0.5 ML PEN: 1.5 | 28 days supply | Qty: 2 | Fill #0 | Status: TO

## 2018-07-10 MED FILL — PRAVASTATIN NA 40 MG TAB: 40 | 90 days supply | Qty: 90 | Fill #0

## 2018-07-26 DIAGNOSIS — L739 Follicular disorder, unspecified: Secondary | ICD-10-CM | POA: Diagnosis not present

## 2018-07-26 MED FILL — DOXYCYCLINE HYCLATE 100 MG: 100 | 10 days supply | Qty: 20 | Fill #0

## 2018-07-26 MED FILL — TRULICITY 1.5 MG/0.5 ML PEN: 1.5 | 28 days supply | Qty: 2 | Fill #0

## 2018-07-26 MED FILL — FLUCONAZOLE 150 MG TABS: 150 | 1 days supply | Qty: 1 | Fill #0

## 2018-08-20 MED FILL — TRULICITY 1.5 MG/0.5 ML PEN: 1.5 | 28 days supply | Qty: 2 | Fill #1

## 2018-08-20 MED FILL — JANUVIA 100 MG TABLET: 100 | 90 days supply | Qty: 90 | Fill #1

## 2018-08-21 DIAGNOSIS — E1165 Type 2 diabetes mellitus with hyperglycemia: Secondary | ICD-10-CM | POA: Diagnosis not present

## 2018-08-21 DIAGNOSIS — I10 Essential (primary) hypertension: Secondary | ICD-10-CM | POA: Diagnosis not present

## 2018-08-21 DIAGNOSIS — Z7984 Long term (current) use of oral hypoglycemic drugs: Secondary | ICD-10-CM | POA: Diagnosis not present

## 2018-08-21 DIAGNOSIS — B372 Candidiasis of skin and nail: Secondary | ICD-10-CM | POA: Diagnosis not present

## 2018-08-21 MED FILL — NYSTATIN 100,000 UNIT/GM PO: 100000 | 10 days supply | Qty: 60 | Fill #0

## 2018-08-21 MED FILL — metroNIDAZOLE 0.75 % GEL: 0.75 | 30 days supply | Qty: 45 | Fill #0

## 2018-08-27 MED FILL — ACCU-CHEK GUIDE TEST STRIP: 50 days supply | Qty: 100 | Fill #0

## 2018-08-27 MED FILL — ACCU-CHEK FASTCLIX LANCETS: 51 days supply | Qty: 102 | Fill #0

## 2018-09-24 DIAGNOSIS — E782 Mixed hyperlipidemia: Secondary | ICD-10-CM | POA: Diagnosis not present

## 2018-09-24 DIAGNOSIS — E1165 Type 2 diabetes mellitus with hyperglycemia: Secondary | ICD-10-CM | POA: Diagnosis not present

## 2018-09-24 DIAGNOSIS — I10 Essential (primary) hypertension: Secondary | ICD-10-CM | POA: Diagnosis not present

## 2018-09-24 MED FILL — TRULICITY 1.5 MG/0.5 ML PEN: 1.5 | 28 days supply | Qty: 2 | Fill #2

## 2018-09-26 DIAGNOSIS — Z0000 Encounter for general adult medical examination without abnormal findings: Secondary | ICD-10-CM | POA: Diagnosis not present

## 2018-09-26 DIAGNOSIS — L309 Dermatitis, unspecified: Secondary | ICD-10-CM | POA: Diagnosis not present

## 2018-09-26 DIAGNOSIS — N390 Urinary tract infection, site not specified: Secondary | ICD-10-CM | POA: Diagnosis not present

## 2018-09-26 MED FILL — NITROFURANTOIN MONO-MCR 100: 100 | 7 days supply | Qty: 14 | Fill #0

## 2018-09-26 MED FILL — FLUCONAZOLE 200 MG TAB: 200 | 7 days supply | Qty: 7 | Fill #0

## 2018-10-22 MED FILL — TRULICITY 1.5 MG/0.5 ML PEN: 1.5 | 28 days supply | Qty: 2 | Fill #0

## 2018-10-22 MED FILL — PRAVASTATIN NA 40 MG TAB: 40 | 90 days supply | Qty: 90 | Fill #0

## 2018-10-31 DIAGNOSIS — Z1231 Encounter for screening mammogram for malignant neoplasm of breast: Secondary | ICD-10-CM | POA: Diagnosis not present

## 2018-11-01 DIAGNOSIS — L732 Hidradenitis suppurativa: Secondary | ICD-10-CM | POA: Diagnosis not present

## 2018-11-01 DIAGNOSIS — L700 Acne vulgaris: Secondary | ICD-10-CM | POA: Diagnosis not present

## 2018-11-01 DIAGNOSIS — L304 Erythema intertrigo: Secondary | ICD-10-CM | POA: Diagnosis not present

## 2018-11-01 MED FILL — TRIAMCINOLONE 0.1% CREAM: 0.1 | 14 days supply | Qty: 60 | Fill #0

## 2018-11-06 DIAGNOSIS — M4322 Fusion of spine, cervical region: Secondary | ICD-10-CM | POA: Diagnosis not present

## 2018-11-06 DIAGNOSIS — M4722 Other spondylosis with radiculopathy, cervical region: Secondary | ICD-10-CM | POA: Diagnosis not present

## 2018-11-06 DIAGNOSIS — M25511 Pain in right shoulder: Secondary | ICD-10-CM | POA: Diagnosis not present

## 2018-11-06 DIAGNOSIS — M50123 Cervical disc disorder at C6-C7 level with radiculopathy: Secondary | ICD-10-CM | POA: Diagnosis not present

## 2018-11-06 DIAGNOSIS — M4802 Spinal stenosis, cervical region: Secondary | ICD-10-CM | POA: Diagnosis not present

## 2018-11-12 MED FILL — CLINDAMYCIN PH 1% GEL: 1 | 30 days supply | Qty: 60 | Fill #0

## 2018-11-15 MED FILL — ACCU-CHEK GUIDE TEST STRIP: 50 days supply | Qty: 100 | Fill #1

## 2018-11-15 MED FILL — ACCU-CHEK FASTCLIX LANCETS: 51 days supply | Qty: 102 | Fill #1

## 2018-11-15 MED FILL — TRULICITY 1.5 MG/0.5 ML PEN: 1.5 | 28 days supply | Qty: 2 | Fill #1

## 2018-11-20 MED FILL — DAPSONE 7.5 % GEL: 7.5 | 30 days supply | Qty: 60 | Fill #0

## 2018-11-21 MED FILL — DOXYCYCLINE MONOHYDRATE 50: 50 | 30 days supply | Qty: 30 | Fill #0

## 2018-11-21 MED FILL — JANUVIA 100 MG TABLET: 100 | 30 days supply | Qty: 30 | Fill #0

## 2018-11-27 DIAGNOSIS — N6489 Other specified disorders of breast: Secondary | ICD-10-CM | POA: Diagnosis not present

## 2018-11-27 DIAGNOSIS — R928 Other abnormal and inconclusive findings on diagnostic imaging of breast: Secondary | ICD-10-CM | POA: Diagnosis not present

## 2018-12-18 MED FILL — JANUVIA 100 MG TABLET: 100 | 30 days supply | Qty: 30 | Fill #1

## 2018-12-18 MED FILL — TRULICITY 1.5 MG/0.5 ML PEN: 1.5 | 28 days supply | Qty: 2 | Fill #2

## 2018-12-18 MED FILL — DOXYCYCLINE MONOHYDRATE 50: 50 | 30 days supply | Qty: 30 | Fill #1

## 2018-12-25 MED FILL — CARVEDILOL 12.5 MG TABLET: 12.5 | 90 days supply | Qty: 180 | Fill #0

## 2019-01-04 DIAGNOSIS — M5031 Other cervical disc degeneration,  high cervical region: Secondary | ICD-10-CM | POA: Diagnosis not present

## 2019-01-04 DIAGNOSIS — M542 Cervicalgia: Secondary | ICD-10-CM | POA: Diagnosis not present

## 2019-01-04 DIAGNOSIS — M62838 Other muscle spasm: Secondary | ICD-10-CM | POA: Diagnosis not present

## 2019-01-04 MED FILL — CYCLOBENZAPRINE HCL 5 MG TA: 5 | 7 days supply | Qty: 21 | Fill #0

## 2019-01-11 DIAGNOSIS — L700 Acne vulgaris: Secondary | ICD-10-CM | POA: Diagnosis not present

## 2019-01-11 DIAGNOSIS — L732 Hidradenitis suppurativa: Secondary | ICD-10-CM | POA: Diagnosis not present

## 2019-01-16 MED FILL — TRULICITY 1.5 MG/0.5 ML PEN: 1.5 | 28 days supply | Qty: 2 | Fill #0

## 2019-01-18 MED FILL — ACCU-CHEK FASTCLIX LANCETS: 51 days supply | Qty: 102 | Fill #2

## 2019-01-18 MED FILL — PRAVASTATIN NA 40 MG TAB: 40 | 60 days supply | Qty: 60 | Fill #1

## 2019-01-18 MED FILL — DOXYCYCLINE HYC 50 MG CAP: 50 | 30 days supply | Qty: 30 | Fill #0

## 2019-01-18 MED FILL — JANUVIA 100 MG TABLET: 100 | 30 days supply | Qty: 30 | Fill #2

## 2019-01-18 MED FILL — ACCU-CHEK GUIDE TEST STRIP: 50 days supply | Qty: 100 | Fill #2

## 2019-01-21 DIAGNOSIS — E669 Obesity, unspecified: Secondary | ICD-10-CM | POA: Diagnosis not present

## 2019-01-21 DIAGNOSIS — G43009 Migraine without aura, not intractable, without status migrainosus: Secondary | ICD-10-CM | POA: Diagnosis not present

## 2019-01-21 DIAGNOSIS — R5383 Other fatigue: Secondary | ICD-10-CM | POA: Diagnosis not present

## 2019-01-21 DIAGNOSIS — I10 Essential (primary) hypertension: Secondary | ICD-10-CM | POA: Diagnosis not present

## 2019-01-21 DIAGNOSIS — E1165 Type 2 diabetes mellitus with hyperglycemia: Secondary | ICD-10-CM | POA: Diagnosis not present

## 2019-01-21 DIAGNOSIS — D500 Iron deficiency anemia secondary to blood loss (chronic): Secondary | ICD-10-CM | POA: Diagnosis not present

## 2019-01-21 DIAGNOSIS — E782 Mixed hyperlipidemia: Secondary | ICD-10-CM | POA: Diagnosis not present

## 2019-01-21 DIAGNOSIS — L719 Rosacea, unspecified: Secondary | ICD-10-CM | POA: Diagnosis not present

## 2019-01-25 DIAGNOSIS — Z6830 Body mass index (BMI) 30.0-30.9, adult: Secondary | ICD-10-CM | POA: Diagnosis not present

## 2019-01-25 DIAGNOSIS — E782 Mixed hyperlipidemia: Secondary | ICD-10-CM | POA: Diagnosis not present

## 2019-01-25 DIAGNOSIS — R5383 Other fatigue: Secondary | ICD-10-CM | POA: Diagnosis not present

## 2019-01-25 DIAGNOSIS — D500 Iron deficiency anemia secondary to blood loss (chronic): Secondary | ICD-10-CM | POA: Diagnosis not present

## 2019-01-25 DIAGNOSIS — E669 Obesity, unspecified: Secondary | ICD-10-CM | POA: Diagnosis not present

## 2019-01-25 DIAGNOSIS — I10 Essential (primary) hypertension: Secondary | ICD-10-CM | POA: Diagnosis not present

## 2019-01-25 DIAGNOSIS — E1165 Type 2 diabetes mellitus with hyperglycemia: Secondary | ICD-10-CM | POA: Diagnosis not present

## 2019-01-28 DIAGNOSIS — E1165 Type 2 diabetes mellitus with hyperglycemia: Secondary | ICD-10-CM | POA: Diagnosis not present

## 2019-01-28 DIAGNOSIS — I10 Essential (primary) hypertension: Secondary | ICD-10-CM | POA: Diagnosis not present

## 2019-01-28 DIAGNOSIS — E782 Mixed hyperlipidemia: Secondary | ICD-10-CM | POA: Diagnosis not present

## 2019-01-28 DIAGNOSIS — E669 Obesity, unspecified: Secondary | ICD-10-CM | POA: Diagnosis not present

## 2019-01-28 MED FILL — LISINOPRIL 10 MG TABS: 10 | 90 days supply | Qty: 90 | Fill #0

## 2019-02-19 MED FILL — JANUVIA 100 MG TABLET: 100 | 30 days supply | Qty: 30 | Fill #3

## 2019-02-19 MED FILL — DOXYCYCLINE HYC 50 MG CAP: 50 | 30 days supply | Qty: 30 | Fill #1

## 2019-02-19 MED FILL — TRULICITY 1.5 MG/0.5 ML PEN: 1.5 | 28 days supply | Qty: 2 | Fill #1

## 2019-02-20 MED FILL — CARVEDILOL 12.5 MG TABLET: 12.5 | 90 days supply | Qty: 360 | Fill #0

## 2019-03-06 DIAGNOSIS — H401212 Low-tension glaucoma, right eye, moderate stage: Secondary | ICD-10-CM | POA: Diagnosis not present

## 2019-03-06 DIAGNOSIS — E119 Type 2 diabetes mellitus without complications: Secondary | ICD-10-CM | POA: Diagnosis not present

## 2019-03-07 DIAGNOSIS — M4722 Other spondylosis with radiculopathy, cervical region: Secondary | ICD-10-CM | POA: Diagnosis not present

## 2019-03-07 DIAGNOSIS — E119 Type 2 diabetes mellitus without complications: Secondary | ICD-10-CM | POA: Diagnosis not present

## 2019-03-07 DIAGNOSIS — M4802 Spinal stenosis, cervical region: Secondary | ICD-10-CM | POA: Diagnosis not present

## 2019-03-07 DIAGNOSIS — M722 Plantar fascial fibromatosis: Secondary | ICD-10-CM | POA: Diagnosis not present

## 2019-03-07 DIAGNOSIS — G5762 Lesion of plantar nerve, left lower limb: Secondary | ICD-10-CM | POA: Diagnosis not present

## 2019-03-07 DIAGNOSIS — M7732 Calcaneal spur, left foot: Secondary | ICD-10-CM | POA: Diagnosis not present

## 2019-03-07 DIAGNOSIS — M4322 Fusion of spine, cervical region: Secondary | ICD-10-CM | POA: Diagnosis not present

## 2019-03-07 DIAGNOSIS — M7731 Calcaneal spur, right foot: Secondary | ICD-10-CM | POA: Diagnosis not present

## 2019-03-07 DIAGNOSIS — G5761 Lesion of plantar nerve, right lower limb: Secondary | ICD-10-CM | POA: Diagnosis not present

## 2019-03-07 MED FILL — predniSONE 10 MG TABS: 10 | 6 days supply | Qty: 21 | Fill #0

## 2019-03-21 MED FILL — JANUVIA 100 MG TABLET: 100 | 30 days supply | Qty: 30 | Fill #4

## 2019-03-21 MED FILL — TRULICITY 1.5 MG/0.5 ML PEN: 1.5 | 28 days supply | Qty: 2 | Fill #2

## 2019-03-25 MED FILL — PRAVASTATIN NA 40 MG TAB: 40 | 60 days supply | Qty: 60 | Fill #0

## 2019-04-02 ENCOUNTER — Other Ambulatory Visit (HOSPITAL_BASED_OUTPATIENT_CLINIC_OR_DEPARTMENT_OTHER): Payer: Self-pay | Admitting: Ophthalmology

## 2019-04-02 MED FILL — TIMOLOL 0.5% EYE DROPS: 0.5 | 90 days supply | Qty: 5 | Fill #0

## 2019-04-03 MED FILL — DOXYCYCLINE HYC 50 MG CAP: 50 | 30 days supply | Qty: 30 | Fill #2

## 2019-04-19 MED FILL — LISINOPRIL 10 MG TABS: 10 | 90 days supply | Qty: 90 | Fill #1

## 2019-04-19 MED FILL — JANUVIA 100 MG TABLET: 100 | 30 days supply | Qty: 30 | Fill #5

## 2019-04-19 MED FILL — TRULICITY 1.5 MG/0.5 ML PEN: 1.5 | 28 days supply | Qty: 2 | Fill #0

## 2019-05-06 MED FILL — DOXYCYCLINE HYC 50 MG CAP: 50 | 30 days supply | Qty: 30 | Fill #3

## 2019-05-17 MED FILL — TRULICITY 1.5 MG/0.5 ML PEN: 1.5 | 28 days supply | Qty: 2 | Fill #1

## 2019-05-21 MED FILL — JANUVIA 100 MG TABLET: 100 | 30 days supply | Qty: 30 | Fill #0

## 2019-05-21 MED FILL — PRAVASTATIN NA 40 MG TAB: 40 | 60 days supply | Qty: 60 | Fill #1

## 2019-05-29 DIAGNOSIS — E1165 Type 2 diabetes mellitus with hyperglycemia: Secondary | ICD-10-CM | POA: Diagnosis not present

## 2019-05-29 DIAGNOSIS — E782 Mixed hyperlipidemia: Secondary | ICD-10-CM | POA: Diagnosis not present

## 2019-05-29 DIAGNOSIS — I10 Essential (primary) hypertension: Secondary | ICD-10-CM | POA: Diagnosis not present

## 2019-05-29 DIAGNOSIS — E669 Obesity, unspecified: Secondary | ICD-10-CM | POA: Diagnosis not present

## 2019-05-31 ENCOUNTER — Other Ambulatory Visit (HOSPITAL_BASED_OUTPATIENT_CLINIC_OR_DEPARTMENT_OTHER): Payer: Self-pay | Admitting: Internal Medicine

## 2019-05-31 DIAGNOSIS — R5383 Other fatigue: Secondary | ICD-10-CM | POA: Diagnosis not present

## 2019-05-31 DIAGNOSIS — I10 Essential (primary) hypertension: Secondary | ICD-10-CM | POA: Diagnosis not present

## 2019-05-31 DIAGNOSIS — E1165 Type 2 diabetes mellitus with hyperglycemia: Secondary | ICD-10-CM | POA: Diagnosis not present

## 2019-05-31 DIAGNOSIS — D500 Iron deficiency anemia secondary to blood loss (chronic): Secondary | ICD-10-CM | POA: Diagnosis not present

## 2019-05-31 DIAGNOSIS — E782 Mixed hyperlipidemia: Secondary | ICD-10-CM | POA: Diagnosis not present

## 2019-05-31 DIAGNOSIS — E669 Obesity, unspecified: Secondary | ICD-10-CM | POA: Diagnosis not present

## 2019-05-31 MED FILL — LISINOPRIL 20 MG TABS: 20 | 90 days supply | Qty: 90 | Fill #0

## 2019-06-03 MED FILL — CARVEDILOL 12.5 MG TABLET: 12.5 | 90 days supply | Qty: 360 | Fill #1

## 2019-06-12 MED FILL — TRULICITY 1.5 MG/0.5 ML PEN: 1.5 | 28 days supply | Qty: 2 | Fill #2

## 2019-06-20 MED FILL — JANUVIA 100 MG TABLET: 100 | 30 days supply | Qty: 30 | Fill #1

## 2019-06-29 DIAGNOSIS — J029 Acute pharyngitis, unspecified: Secondary | ICD-10-CM | POA: Diagnosis not present

## 2019-06-29 DIAGNOSIS — Z299 Encounter for prophylactic measures, unspecified: Secondary | ICD-10-CM | POA: Diagnosis not present

## 2019-07-12 MED FILL — TRULICITY 1.5 MG/0.5 ML PEN: 1.5 | 28 days supply | Qty: 2 | Fill #0

## 2019-07-23 MED FILL — JANUVIA 100 MG TABLET: 100 | 30 days supply | Qty: 30 | Fill #2

## 2019-07-23 MED FILL — PRAVASTATIN NA 40 MG TAB: 40 | 60 days supply | Qty: 60 | Fill #2

## 2019-07-24 MED FILL — LISINOPRIL 10 MG TABS: 10 | 90 days supply | Qty: 90 | Fill #2

## 2019-07-24 MED FILL — DOXYCYCLINE HYC 50 MG CAP: 50 | 30 days supply | Qty: 30 | Fill #0

## 2019-08-06 MED FILL — TRULICITY 1.5 MG/0.5 ML PEN: 1.5 | 28 days supply | Qty: 2 | Fill #1

## 2019-08-22 MED FILL — JANUVIA 100 MG TABLET: 100 | 30 days supply | Qty: 30 | Fill #3

## 2019-08-22 MED FILL — DOXYCYCLINE HYC 50 MG CAP: 50 | 30 days supply | Qty: 30 | Fill #1

## 2019-09-04 MED FILL — TRULICITY 1.5 MG/0.5 ML PEN: 1.5 | 28 days supply | Qty: 2 | Fill #2

## 2019-09-04 MED FILL — CARVEDILOL 12.5 MG TABLET: 12.5 | 90 days supply | Qty: 360 | Fill #2

## 2019-09-04 MED FILL — TIMOLOL MALEATE 0.5 % SOLN: 0.5 | 90 days supply | Qty: 5 | Fill #1

## 2019-09-04 MED FILL — LISINOPRIL 20 MG TABS: 20 | 90 days supply | Qty: 90 | Fill #1

## 2019-09-16 MED FILL — JANUVIA 100 MG TABLET: 100 | 30 days supply | Qty: 30 | Fill #4

## 2019-09-16 MED FILL — PRAVASTATIN NA 40 MG TAB: 40 | 60 days supply | Qty: 60 | Fill #0

## 2019-10-03 MED FILL — DOXYCYCLINE HYC 50 MG CAP: 50 | 30 days supply | Qty: 30 | Fill #2

## 2019-10-08 DIAGNOSIS — L732 Hidradenitis suppurativa: Secondary | ICD-10-CM | POA: Diagnosis not present

## 2019-10-08 DIAGNOSIS — L918 Other hypertrophic disorders of the skin: Secondary | ICD-10-CM | POA: Diagnosis not present

## 2019-10-08 DIAGNOSIS — L700 Acne vulgaris: Secondary | ICD-10-CM | POA: Diagnosis not present

## 2019-10-08 DIAGNOSIS — Z5181 Encounter for therapeutic drug level monitoring: Secondary | ICD-10-CM | POA: Diagnosis not present

## 2019-10-14 ENCOUNTER — Other Ambulatory Visit (HOSPITAL_BASED_OUTPATIENT_CLINIC_OR_DEPARTMENT_OTHER): Payer: Self-pay | Admitting: Internal Medicine

## 2019-10-14 MED FILL — TRULICITY 1.5 MG/0.5 ML PEN: 1.5 | 84 days supply | Qty: 6 | Fill #0

## 2019-10-15 DIAGNOSIS — L719 Rosacea, unspecified: Secondary | ICD-10-CM | POA: Diagnosis not present

## 2019-10-15 DIAGNOSIS — I10 Essential (primary) hypertension: Secondary | ICD-10-CM | POA: Diagnosis not present

## 2019-10-15 DIAGNOSIS — Z0000 Encounter for general adult medical examination without abnormal findings: Secondary | ICD-10-CM | POA: Diagnosis not present

## 2019-10-15 DIAGNOSIS — D500 Iron deficiency anemia secondary to blood loss (chronic): Secondary | ICD-10-CM | POA: Diagnosis not present

## 2019-10-15 DIAGNOSIS — Z6832 Body mass index (BMI) 32.0-32.9, adult: Secondary | ICD-10-CM | POA: Diagnosis not present

## 2019-10-15 DIAGNOSIS — Z1211 Encounter for screening for malignant neoplasm of colon: Secondary | ICD-10-CM | POA: Diagnosis not present

## 2019-10-15 DIAGNOSIS — E1165 Type 2 diabetes mellitus with hyperglycemia: Secondary | ICD-10-CM | POA: Diagnosis not present

## 2019-10-15 DIAGNOSIS — E782 Mixed hyperlipidemia: Secondary | ICD-10-CM | POA: Diagnosis not present

## 2019-10-15 DIAGNOSIS — E6609 Other obesity due to excess calories: Secondary | ICD-10-CM | POA: Diagnosis not present

## 2019-10-15 DIAGNOSIS — Z1231 Encounter for screening mammogram for malignant neoplasm of breast: Secondary | ICD-10-CM | POA: Diagnosis not present

## 2019-10-16 ENCOUNTER — Other Ambulatory Visit (HOSPITAL_BASED_OUTPATIENT_CLINIC_OR_DEPARTMENT_OTHER): Payer: Self-pay | Admitting: Internal Medicine

## 2019-10-16 MED FILL — VIT D2 1.25 MG (50,000 UNIT: 1.25 MG | 84 days supply | Qty: 12 | Fill #0

## 2019-10-16 MED FILL — ATORVASTATIN CALCIUM 40 MG: 40 | 90 days supply | Qty: 90 | Fill #0

## 2019-10-22 MED FILL — JANUVIA 100 MG TABLET: 100 | 30 days supply | Qty: 30 | Fill #5

## 2019-11-01 MED FILL — DOXYCYCLINE HYC 50 MG CAP: 50 | 30 days supply | Qty: 30 | Fill #3

## 2019-11-08 DIAGNOSIS — Z5181 Encounter for therapeutic drug level monitoring: Secondary | ICD-10-CM | POA: Diagnosis not present

## 2019-11-08 DIAGNOSIS — L700 Acne vulgaris: Secondary | ICD-10-CM | POA: Diagnosis not present

## 2019-11-08 MED FILL — MYORISAN 40 MG CAPSULE: 40 | 30 days supply | Qty: 60 | Fill #0

## 2019-11-13 DIAGNOSIS — N6012 Diffuse cystic mastopathy of left breast: Secondary | ICD-10-CM | POA: Diagnosis not present

## 2019-11-13 DIAGNOSIS — R928 Other abnormal and inconclusive findings on diagnostic imaging of breast: Secondary | ICD-10-CM | POA: Diagnosis not present

## 2019-11-13 DIAGNOSIS — Z1231 Encounter for screening mammogram for malignant neoplasm of breast: Secondary | ICD-10-CM | POA: Diagnosis not present

## 2019-11-22 ENCOUNTER — Other Ambulatory Visit (HOSPITAL_BASED_OUTPATIENT_CLINIC_OR_DEPARTMENT_OTHER): Payer: Self-pay | Admitting: Internal Medicine

## 2019-11-25 MED FILL — JANUVIA 100 MG TABLET: 100 | 30 days supply | Qty: 30 | Fill #0

## 2019-12-06 MED FILL — CARVEDILOL 12.5 MG TABLET: 12.5 | 90 days supply | Qty: 360 | Fill #3

## 2019-12-13 MED FILL — LISINOPRIL 20 MG TABS: 20 | 90 days supply | Qty: 90 | Fill #2

## 2019-12-19 IMAGING — MR MR CERVICAL SPINE WO/W CM
5 of 8 series · 28 of 48 positions shown · IV contrast (multihance)
Comparison: MRI of the cervical spine May 06, 2017

CLINICAL DATA: Severe LEFT neck and arm pain beginning 1 hour ago.
Status post spine steroid injection 2 days ago.

EXAM:
MRI CERVICAL SPINE WITHOUT AND WITH CONTRAST
TECHNIQUE: Multiplanar and multiecho pulse sequences of the cervical spine, to
include the craniocervical junction and cervicothoracic junction,
were obtained without and with intravenous contrast.
CONTRAST:  15mL MULTIHANCE GADOBENATE DIMEGLUMINE 529 MG/ML IV SOLN

[Series 5001: T2 · sagittal · 3.0mm · 0.69mm/px · 4 of 15 slices shown (1 of 2)]
[im 1/15]
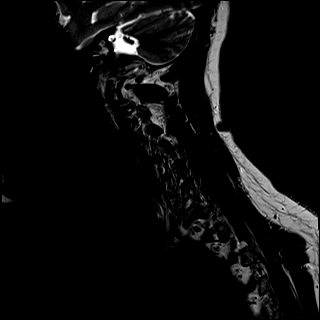
[im 5/15]
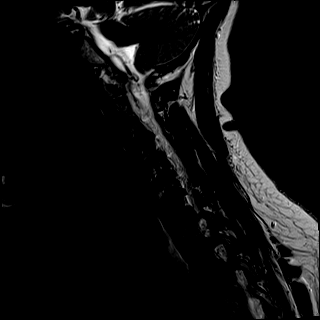
[im 10/15]
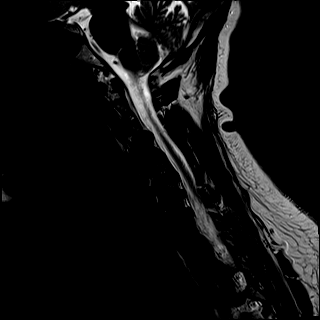
[im 15/15]
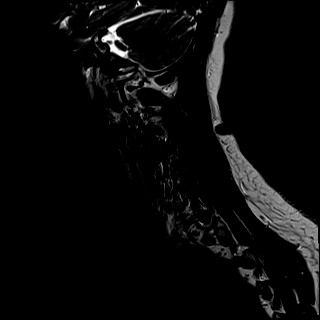

[Series 6001: T1 · sagittal · 3.0mm · 0.69mm/px · 4 of 15 slices shown (1 of 2)]
[im 1/15]
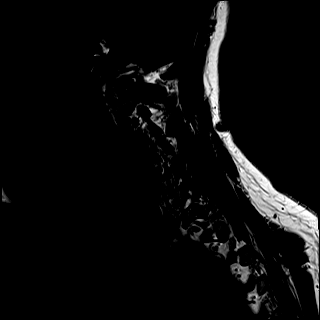
[im 5/15]
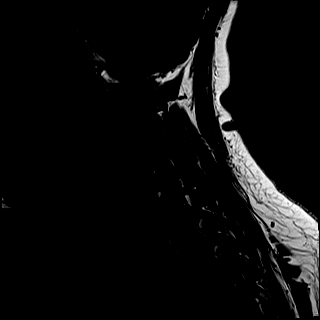
[im 10/15]
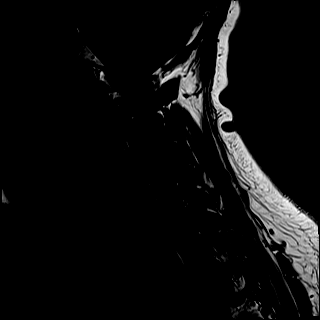
[im 15/15]
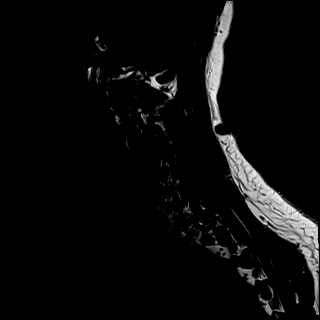

[Series 8001: T2 · axial · 3.0mm · 0.66mm/px · z∈[-77,+17]mm · 8 of 32 slices shown (2 of 2)]
[im 1/32]
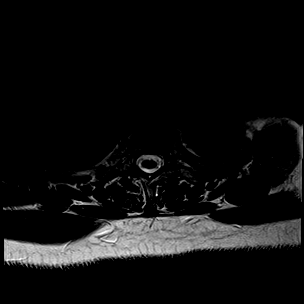
[im 5/32]
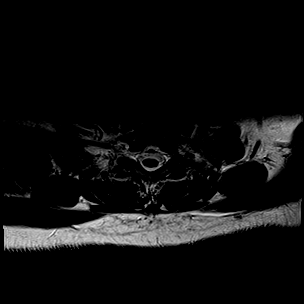
[im 9/32]
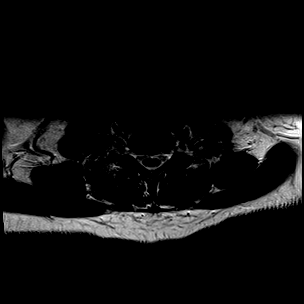
[im 14/32]
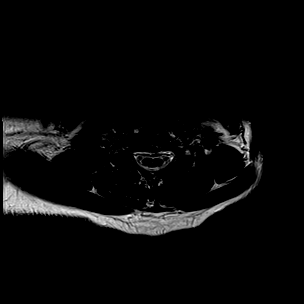
[im 18/32]
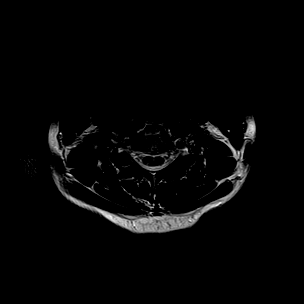
[im 23/32]
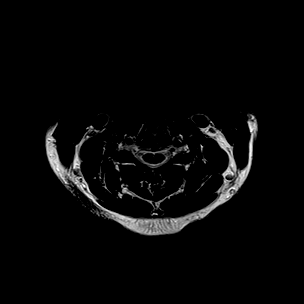
[im 27/32]
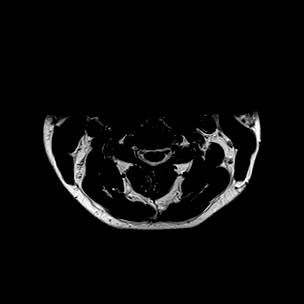
[im 32/32]
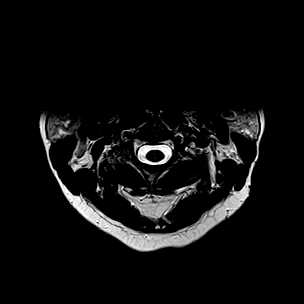

[T1 · axial · 3.0mm · 0.39mm/px · z∈[-77,+17]mm · 8 of 32 slices shown (2 of 2)]
[im 1/32]
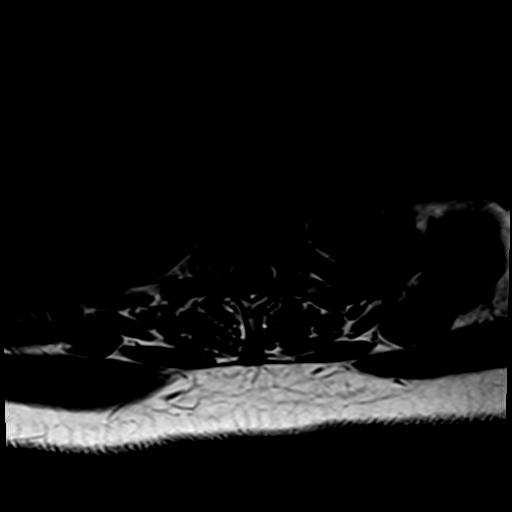
[im 5/32]
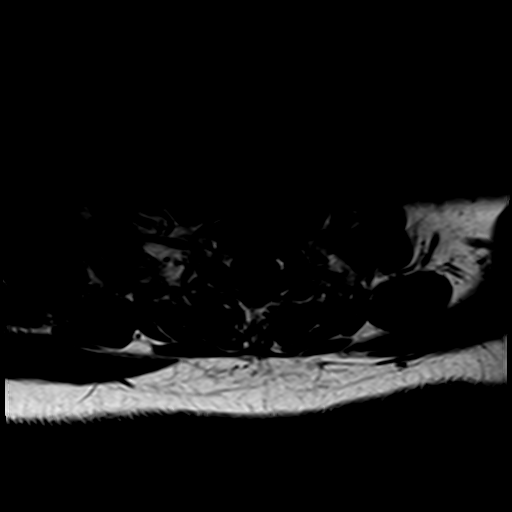
[im 9/32]
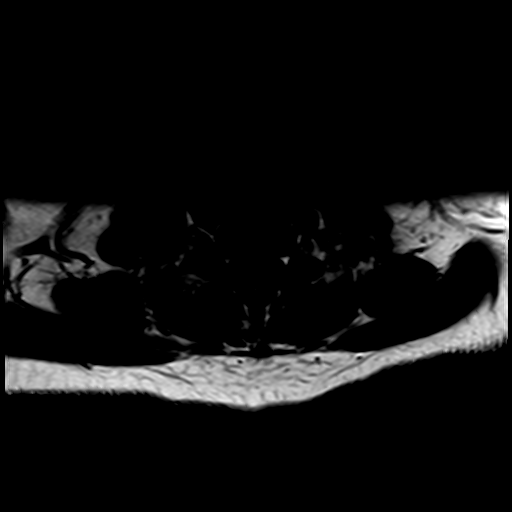
[im 14/32]
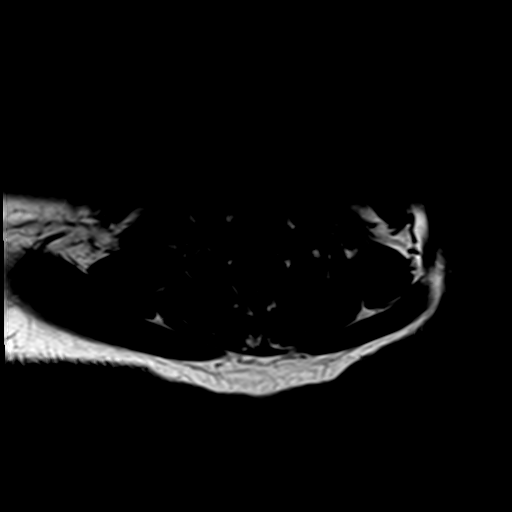
[im 18/32]
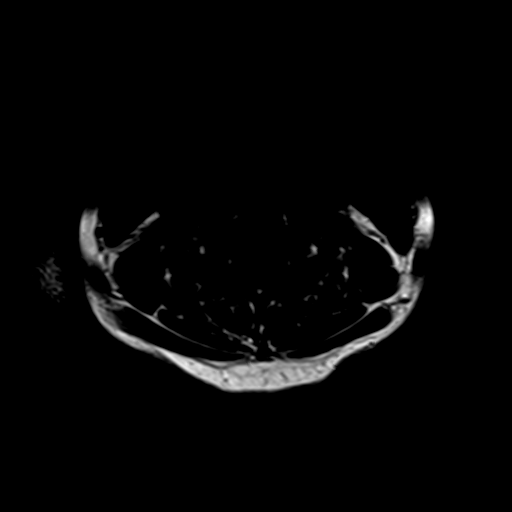
[im 23/32]
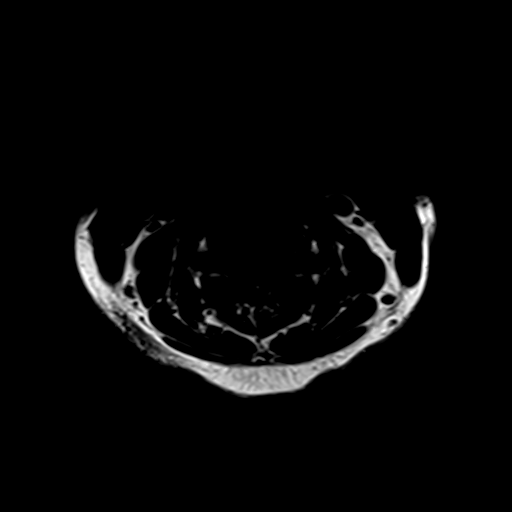
[im 27/32]
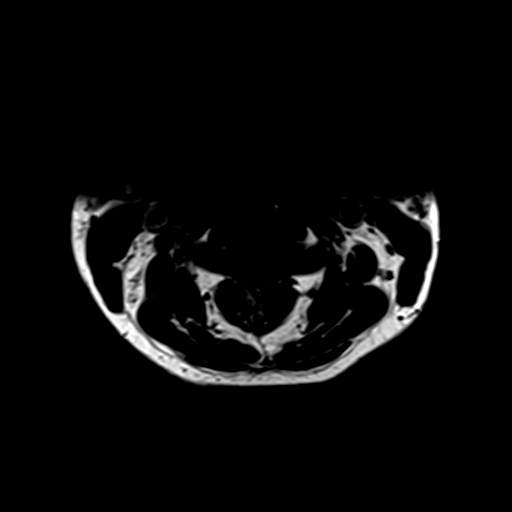
[im 32/32]
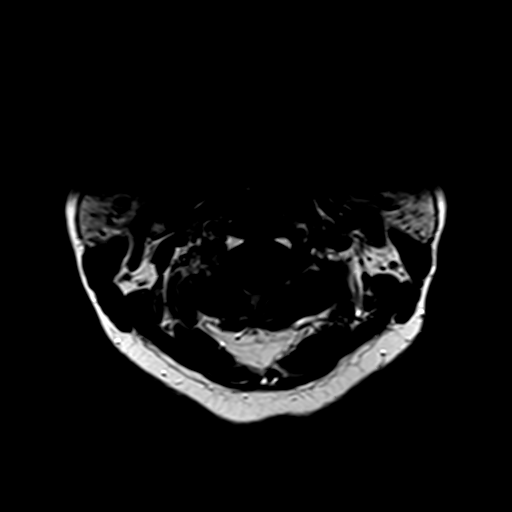

[T1 fat-sat post-contrast · sagittal · 3.0mm · 0.43mm/px · 4 of 15 slices shown]
[im 1/15]
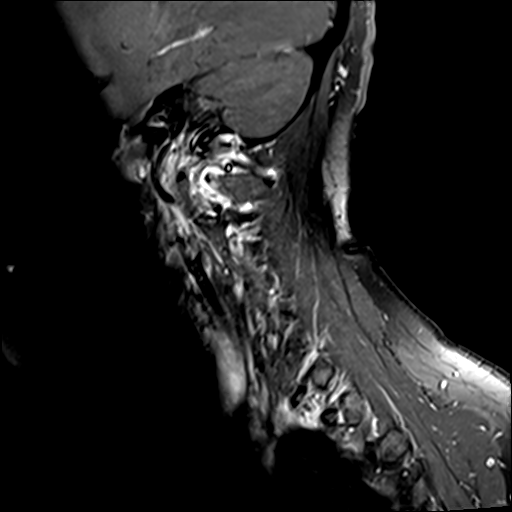
[im 5/15]
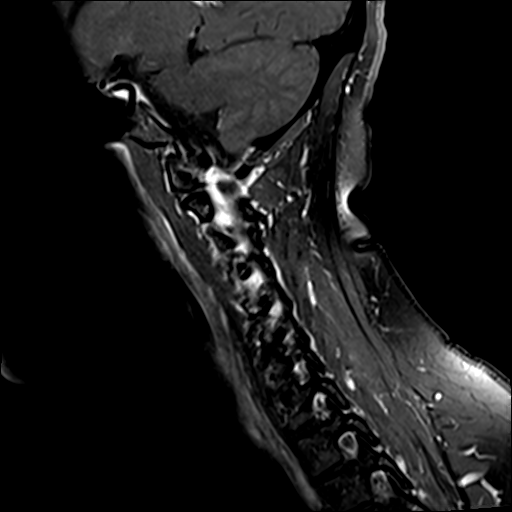
[im 10/15]
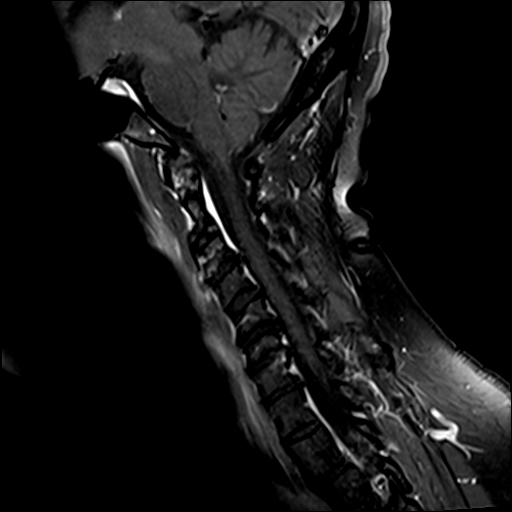
[im 15/15]
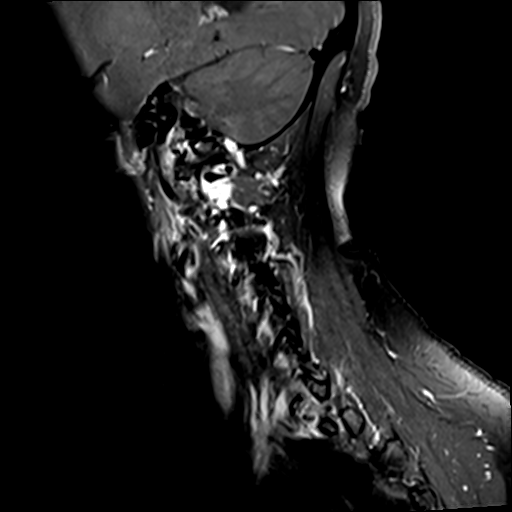

[28 of 48 positions shown; findings below may reference images not displayed]

FINDINGS: ALIGNMENT: Straightened cervical lordosis.  No malalignment.

VERTEBRAE/DISCS: Vertebral bodies are intact. Stable moderate to
severe C6-7 disc height loss, moderate at C3-4 in C5-6 with similar
mild disc edema C3-4, increasing moderate acute on chronic
discogenic endplate changes C3-4. Stable moderate chronic discogenic
endplate changes C5-6 and C6-7. No suspicious bone marrow signal. No
abnormal osseous or disc enhancement.

CORD:Cervical spinal cord is normal morphology and signal
characteristics from the cervicomedullary junction to level of T2-3,
the most caudal well visualized level. No abnormal cord,
leptomeningeal or epidural enhancement.

POSTERIOR FOSSA, VERTEBRAL ARTERIES, PARASPINAL TISSUES: No MR
findings of ligamentous injury. Vertebral artery flow voids present.
Included posterior fossa and paraspinal soft tissues are normal.

DISC LEVELS:

C2-3: No disc bulge, canal stenosis nor neural foraminal narrowing.
Mild facet arthropathy.

C3-4: Small broad-based disc bulge, uncovertebral hypertrophy and
mild facet arthropathy. Mild canal stenosis. Mild to moderate RIGHT
neural foraminal narrowing.

C4-5: No disc bulge, canal stenosis nor neural foraminal narrowing.
Mild uncovertebral hypertrophy.

C5-6: Small broad-based disc bulge, uncovertebral hypertrophy. No
canal stenosis or neural foraminal narrowing.

C6-7: Small broad-based disc bulge/central disc protrusion.
Uncovertebral hypertrophy. Minimal canal stenosis. Moderate to
severe RIGHT, mild LEFT neural foraminal narrowing is similar.

C7-T1: Small broad-based disc bulge asymmetric to the RIGHT,
uncovertebral hypertrophy. No canal stenosis. Mild RIGHT greater
than LEFT neural foraminal narrowing.
IMPRESSION: 1. No fracture or malalignment. Similar degenerative change of the
cervical spine.
2. Mild canal stenosis C3-4, minimal canal stenosis C6-7.
3. Neural foraminal narrowing C3-4, C6-7 and C7-T1: Moderate to
severe on the RIGHT at C6-7.

## 2019-12-25 MED FILL — JANUVIA 100 MG TABLET: 100 | 30 days supply | Qty: 30 | Fill #1

## 2020-01-01 MED FILL — TRULICITY 1.5 MG/0.5 ML PEN: 1.5 | 84 days supply | Qty: 6 | Fill #1

## 2020-01-13 ENCOUNTER — Other Ambulatory Visit (HOSPITAL_BASED_OUTPATIENT_CLINIC_OR_DEPARTMENT_OTHER): Payer: Self-pay | Admitting: Internal Medicine

## 2020-01-13 MED FILL — FLUCONAZOLE 200 MG TAB: 200 | 3 days supply | Qty: 3 | Fill #0

## 2020-01-24 MED FILL — ATORVASTATIN CALCIUM 40 MG: 40 | 90 days supply | Qty: 90 | Fill #1

## 2020-01-24 MED FILL — JANUVIA 100 MG TABLET: 100 | 30 days supply | Qty: 30 | Fill #2

## 2020-02-24 MED FILL — TIMOLOL MALEATE 0.5 % SOLN: 0.5 | 90 days supply | Qty: 5 | Fill #2

## 2020-02-24 MED FILL — JANUVIA 100 MG TABLET: 100 | 30 days supply | Qty: 30 | Fill #3

## 2020-03-10 MED FILL — LISINOPRIL 20 MG TABS: 20 | 90 days supply | Qty: 90 | Fill #3

## 2020-03-11 ENCOUNTER — Other Ambulatory Visit (HOSPITAL_BASED_OUTPATIENT_CLINIC_OR_DEPARTMENT_OTHER): Payer: Self-pay | Admitting: Internal Medicine

## 2020-03-11 MED FILL — CARVEDILOL 12.5 MG TABLET: 12.5 | 90 days supply | Qty: 360 | Fill #0

## 2020-03-23 MED FILL — TRULICITY 1.5 MG/0.5 ML PEN: 1.5 | 84 days supply | Qty: 6 | Fill #2

## 2020-03-23 MED FILL — JANUVIA 100 MG TABLET: 100 | 30 days supply | Qty: 30 | Fill #4

## 2020-04-08 MED FILL — TRULICITY 1.5 MG/0.5 ML PEN: 1.5 | 28 days supply | Qty: 2 | Fill #3

## 2020-04-24 MED FILL — JANUVIA 100 MG TABLET: 100 | 30 days supply | Qty: 30 | Fill #5

## 2020-05-25 ENCOUNTER — Other Ambulatory Visit (HOSPITAL_BASED_OUTPATIENT_CLINIC_OR_DEPARTMENT_OTHER): Payer: Self-pay

## 2020-05-25 MED FILL — Atorvastatin Calcium Tab 40 MG (Base Equivalent): ORAL | 90 days supply | Qty: 90 | Fill #0 | Status: AC

## 2020-05-27 ENCOUNTER — Other Ambulatory Visit (HOSPITAL_BASED_OUTPATIENT_CLINIC_OR_DEPARTMENT_OTHER): Payer: Self-pay

## 2020-05-28 ENCOUNTER — Other Ambulatory Visit (HOSPITAL_BASED_OUTPATIENT_CLINIC_OR_DEPARTMENT_OTHER): Payer: Self-pay

## 2020-05-28 MED ORDER — JANUVIA 100 MG PO TABS
100.0000 mg | ORAL_TABLET | Freq: Every day | ORAL | 5 refills | Status: DC
  Filled 2020-05-28: qty 30, 30d supply, fill #0
  Filled 2020-06-26: qty 30, 30d supply, fill #1
  Filled 2020-07-27: qty 30, 30d supply, fill #2
  Filled 2020-08-21: qty 30, 30d supply, fill #3
  Filled 2020-09-21: qty 30, 30d supply, fill #4
  Filled 2020-10-21: qty 30, 30d supply, fill #5

## 2020-06-02 DIAGNOSIS — G43109 Migraine with aura, not intractable, without status migrainosus: Secondary | ICD-10-CM | POA: Diagnosis not present

## 2020-06-02 DIAGNOSIS — D500 Iron deficiency anemia secondary to blood loss (chronic): Secondary | ICD-10-CM | POA: Diagnosis not present

## 2020-06-02 DIAGNOSIS — M4802 Spinal stenosis, cervical region: Secondary | ICD-10-CM | POA: Diagnosis not present

## 2020-06-02 DIAGNOSIS — Z6832 Body mass index (BMI) 32.0-32.9, adult: Secondary | ICD-10-CM | POA: Diagnosis not present

## 2020-06-02 DIAGNOSIS — E1165 Type 2 diabetes mellitus with hyperglycemia: Secondary | ICD-10-CM | POA: Diagnosis not present

## 2020-06-02 DIAGNOSIS — E782 Mixed hyperlipidemia: Secondary | ICD-10-CM | POA: Diagnosis not present

## 2020-06-02 DIAGNOSIS — I10 Essential (primary) hypertension: Secondary | ICD-10-CM | POA: Diagnosis not present

## 2020-06-02 DIAGNOSIS — M5416 Radiculopathy, lumbar region: Secondary | ICD-10-CM | POA: Diagnosis not present

## 2020-06-02 DIAGNOSIS — E6609 Other obesity due to excess calories: Secondary | ICD-10-CM | POA: Diagnosis not present

## 2020-06-03 ENCOUNTER — Other Ambulatory Visit (HOSPITAL_BASED_OUTPATIENT_CLINIC_OR_DEPARTMENT_OTHER): Payer: Self-pay

## 2020-06-03 MED ORDER — GLIPIZIDE ER 10 MG PO TB24
10.0000 mg | ORAL_TABLET | Freq: Every day | ORAL | 3 refills | Status: DC
  Filled 2020-06-03: qty 90, 90d supply, fill #0
  Filled 2020-09-10: qty 90, 90d supply, fill #1

## 2020-06-03 MED ORDER — VITAMIN D (ERGOCALCIFEROL) 1.25 MG (50000 UNIT) PO CAPS
ORAL_CAPSULE | ORAL | 3 refills | Status: DC
  Filled 2020-06-03: qty 12, 84d supply, fill #0
  Filled 2020-08-21: qty 12, 84d supply, fill #1
  Filled 2020-11-13: qty 12, 84d supply, fill #2

## 2020-06-11 ENCOUNTER — Other Ambulatory Visit (HOSPITAL_BASED_OUTPATIENT_CLINIC_OR_DEPARTMENT_OTHER): Payer: Self-pay

## 2020-06-20 ENCOUNTER — Other Ambulatory Visit (HOSPITAL_COMMUNITY): Payer: Self-pay

## 2020-06-20 MED FILL — Carvedilol Tab 12.5 MG: ORAL | 90 days supply | Qty: 360 | Fill #0 | Status: AC

## 2020-06-26 ENCOUNTER — Other Ambulatory Visit (HOSPITAL_BASED_OUTPATIENT_CLINIC_OR_DEPARTMENT_OTHER): Payer: Self-pay

## 2020-07-15 ENCOUNTER — Other Ambulatory Visit (HOSPITAL_BASED_OUTPATIENT_CLINIC_OR_DEPARTMENT_OTHER): Payer: Self-pay

## 2020-07-15 DIAGNOSIS — E119 Type 2 diabetes mellitus without complications: Secondary | ICD-10-CM | POA: Diagnosis not present

## 2020-07-15 DIAGNOSIS — H401212 Low-tension glaucoma, right eye, moderate stage: Secondary | ICD-10-CM | POA: Diagnosis not present

## 2020-07-15 MED FILL — Dulaglutide Soln Auto-injector 1.5 MG/0.5ML: SUBCUTANEOUS | 56 days supply | Qty: 4 | Fill #0 | Status: AC

## 2020-07-27 ENCOUNTER — Other Ambulatory Visit (HOSPITAL_BASED_OUTPATIENT_CLINIC_OR_DEPARTMENT_OTHER): Payer: Self-pay

## 2020-08-18 ENCOUNTER — Other Ambulatory Visit (HOSPITAL_BASED_OUTPATIENT_CLINIC_OR_DEPARTMENT_OTHER): Payer: Self-pay

## 2020-08-19 ENCOUNTER — Other Ambulatory Visit (HOSPITAL_BASED_OUTPATIENT_CLINIC_OR_DEPARTMENT_OTHER): Payer: Self-pay

## 2020-08-21 ENCOUNTER — Other Ambulatory Visit (HOSPITAL_BASED_OUTPATIENT_CLINIC_OR_DEPARTMENT_OTHER): Payer: Self-pay

## 2020-08-21 MED ORDER — LISINOPRIL 20 MG PO TABS
20.0000 mg | ORAL_TABLET | Freq: Every day | ORAL | 1 refills | Status: DC
  Filled 2020-08-21: qty 90, 90d supply, fill #0
  Filled 2020-11-13: qty 90, 90d supply, fill #1

## 2020-08-25 ENCOUNTER — Other Ambulatory Visit (HOSPITAL_BASED_OUTPATIENT_CLINIC_OR_DEPARTMENT_OTHER): Payer: Self-pay

## 2020-08-28 ENCOUNTER — Other Ambulatory Visit: Payer: Self-pay

## 2020-08-28 ENCOUNTER — Other Ambulatory Visit (HOSPITAL_COMMUNITY): Payer: Self-pay

## 2020-09-04 ENCOUNTER — Other Ambulatory Visit (HOSPITAL_BASED_OUTPATIENT_CLINIC_OR_DEPARTMENT_OTHER): Payer: Self-pay

## 2020-09-04 MED FILL — Atorvastatin Calcium Tab 40 MG (Base Equivalent): ORAL | 90 days supply | Qty: 90 | Fill #1 | Status: AC

## 2020-09-09 ENCOUNTER — Other Ambulatory Visit (HOSPITAL_BASED_OUTPATIENT_CLINIC_OR_DEPARTMENT_OTHER): Payer: Self-pay

## 2020-09-09 MED ORDER — TRULICITY 1.5 MG/0.5ML ~~LOC~~ SOAJ
SUBCUTANEOUS | 1 refills | Status: DC
  Filled 2020-09-09: qty 6, 90d supply, fill #0
  Filled 2020-12-03: qty 6, 90d supply, fill #1

## 2020-09-10 ENCOUNTER — Other Ambulatory Visit (HOSPITAL_BASED_OUTPATIENT_CLINIC_OR_DEPARTMENT_OTHER): Payer: Self-pay

## 2020-09-14 ENCOUNTER — Other Ambulatory Visit (HOSPITAL_BASED_OUTPATIENT_CLINIC_OR_DEPARTMENT_OTHER): Payer: Self-pay

## 2020-09-14 MED ORDER — TIMOLOL MALEATE 0.5 % OP SOLN
OPHTHALMIC | 0 refills | Status: DC
  Filled 2020-09-14: qty 5, 90d supply, fill #0

## 2020-09-21 ENCOUNTER — Other Ambulatory Visit (HOSPITAL_BASED_OUTPATIENT_CLINIC_OR_DEPARTMENT_OTHER): Payer: Self-pay

## 2020-09-21 MED FILL — Carvedilol Tab 12.5 MG: ORAL | 90 days supply | Qty: 360 | Fill #1 | Status: AC

## 2020-09-22 ENCOUNTER — Other Ambulatory Visit (HOSPITAL_BASED_OUTPATIENT_CLINIC_OR_DEPARTMENT_OTHER): Payer: Self-pay

## 2020-09-22 DIAGNOSIS — R110 Nausea: Secondary | ICD-10-CM | POA: Diagnosis not present

## 2020-09-22 DIAGNOSIS — I10 Essential (primary) hypertension: Secondary | ICD-10-CM | POA: Diagnosis not present

## 2020-09-22 DIAGNOSIS — E782 Mixed hyperlipidemia: Secondary | ICD-10-CM | POA: Diagnosis not present

## 2020-09-22 DIAGNOSIS — Z6832 Body mass index (BMI) 32.0-32.9, adult: Secondary | ICD-10-CM | POA: Diagnosis not present

## 2020-09-22 DIAGNOSIS — E1165 Type 2 diabetes mellitus with hyperglycemia: Secondary | ICD-10-CM | POA: Diagnosis not present

## 2020-09-22 DIAGNOSIS — L719 Rosacea, unspecified: Secondary | ICD-10-CM | POA: Diagnosis not present

## 2020-09-22 DIAGNOSIS — D500 Iron deficiency anemia secondary to blood loss (chronic): Secondary | ICD-10-CM | POA: Diagnosis not present

## 2020-09-22 DIAGNOSIS — E6609 Other obesity due to excess calories: Secondary | ICD-10-CM | POA: Diagnosis not present

## 2020-09-22 MED ORDER — DOXYCYCLINE HYCLATE 50 MG PO CAPS
50.0000 mg | ORAL_CAPSULE | Freq: Every day | ORAL | 11 refills | Status: DC
  Filled 2020-09-22: qty 30, 30d supply, fill #0
  Filled 2020-10-21: qty 30, 30d supply, fill #1

## 2020-10-01 ENCOUNTER — Other Ambulatory Visit (HOSPITAL_BASED_OUTPATIENT_CLINIC_OR_DEPARTMENT_OTHER): Payer: Self-pay

## 2020-10-01 DIAGNOSIS — H40002 Preglaucoma, unspecified, left eye: Secondary | ICD-10-CM | POA: Diagnosis not present

## 2020-10-01 DIAGNOSIS — H401112 Primary open-angle glaucoma, right eye, moderate stage: Secondary | ICD-10-CM | POA: Diagnosis not present

## 2020-10-01 DIAGNOSIS — Z79899 Other long term (current) drug therapy: Secondary | ICD-10-CM | POA: Diagnosis not present

## 2020-10-01 MED ORDER — TIMOLOL MALEATE 0.5 % OP SOLN
OPHTHALMIC | 11 refills | Status: DC
  Filled 2020-10-01: qty 5, 28d supply, fill #0
  Filled 2021-01-18: qty 5, 90d supply, fill #0

## 2020-10-05 ENCOUNTER — Other Ambulatory Visit (HOSPITAL_BASED_OUTPATIENT_CLINIC_OR_DEPARTMENT_OTHER): Payer: Self-pay

## 2020-10-05 MED ORDER — GLIMEPIRIDE 4 MG PO TABS
ORAL_TABLET | ORAL | 3 refills | Status: DC
  Filled 2020-10-05: qty 90, 90d supply, fill #0
  Filled 2020-10-27 (×2): qty 90, 90d supply, fill #1

## 2020-10-21 ENCOUNTER — Other Ambulatory Visit (HOSPITAL_BASED_OUTPATIENT_CLINIC_OR_DEPARTMENT_OTHER): Payer: Self-pay

## 2020-10-27 ENCOUNTER — Other Ambulatory Visit (HOSPITAL_BASED_OUTPATIENT_CLINIC_OR_DEPARTMENT_OTHER): Payer: Self-pay

## 2020-10-28 ENCOUNTER — Other Ambulatory Visit (HOSPITAL_BASED_OUTPATIENT_CLINIC_OR_DEPARTMENT_OTHER): Payer: Self-pay

## 2020-11-13 ENCOUNTER — Other Ambulatory Visit (HOSPITAL_BASED_OUTPATIENT_CLINIC_OR_DEPARTMENT_OTHER): Payer: Self-pay

## 2020-11-26 ENCOUNTER — Other Ambulatory Visit (HOSPITAL_BASED_OUTPATIENT_CLINIC_OR_DEPARTMENT_OTHER): Payer: Self-pay

## 2020-11-26 MED ORDER — JANUVIA 100 MG PO TABS
100.0000 mg | ORAL_TABLET | Freq: Every day | ORAL | 5 refills | Status: DC
  Filled 2020-11-26: qty 30, 30d supply, fill #0
  Filled 2020-12-28: qty 30, 30d supply, fill #1

## 2020-12-02 DIAGNOSIS — R922 Inconclusive mammogram: Secondary | ICD-10-CM | POA: Diagnosis not present

## 2020-12-02 DIAGNOSIS — R928 Other abnormal and inconclusive findings on diagnostic imaging of breast: Secondary | ICD-10-CM | POA: Diagnosis not present

## 2020-12-03 ENCOUNTER — Other Ambulatory Visit (HOSPITAL_BASED_OUTPATIENT_CLINIC_OR_DEPARTMENT_OTHER): Payer: Self-pay

## 2020-12-16 ENCOUNTER — Other Ambulatory Visit (HOSPITAL_COMMUNITY): Payer: Self-pay

## 2020-12-17 ENCOUNTER — Other Ambulatory Visit (HOSPITAL_COMMUNITY): Payer: Self-pay

## 2020-12-17 MED ORDER — ATORVASTATIN CALCIUM 40 MG PO TABS
40.0000 mg | ORAL_TABLET | Freq: Every day | ORAL | 3 refills | Status: DC
  Filled 2020-12-17 – 2020-12-18 (×2): qty 90, 90d supply, fill #0
  Filled 2021-04-02: qty 90, 90d supply, fill #1
  Filled 2021-07-08: qty 90, 90d supply, fill #2
  Filled 2021-10-27: qty 90, 90d supply, fill #3

## 2020-12-18 ENCOUNTER — Other Ambulatory Visit (HOSPITAL_COMMUNITY): Payer: Self-pay

## 2020-12-18 ENCOUNTER — Other Ambulatory Visit (HOSPITAL_BASED_OUTPATIENT_CLINIC_OR_DEPARTMENT_OTHER): Payer: Self-pay

## 2020-12-28 ENCOUNTER — Other Ambulatory Visit (HOSPITAL_BASED_OUTPATIENT_CLINIC_OR_DEPARTMENT_OTHER): Payer: Self-pay

## 2020-12-28 MED FILL — Carvedilol Tab 12.5 MG: ORAL | 90 days supply | Qty: 360 | Fill #2 | Status: AC

## 2021-01-06 ENCOUNTER — Other Ambulatory Visit (HOSPITAL_BASED_OUTPATIENT_CLINIC_OR_DEPARTMENT_OTHER): Payer: Self-pay

## 2021-01-06 DIAGNOSIS — Z7984 Long term (current) use of oral hypoglycemic drugs: Secondary | ICD-10-CM | POA: Diagnosis not present

## 2021-01-06 DIAGNOSIS — E119 Type 2 diabetes mellitus without complications: Secondary | ICD-10-CM | POA: Diagnosis not present

## 2021-01-06 DIAGNOSIS — Z6834 Body mass index (BMI) 34.0-34.9, adult: Secondary | ICD-10-CM | POA: Diagnosis not present

## 2021-01-06 DIAGNOSIS — E6609 Other obesity due to excess calories: Secondary | ICD-10-CM | POA: Diagnosis not present

## 2021-01-06 DIAGNOSIS — Z7985 Long-term (current) use of injectable non-insulin antidiabetic drugs: Secondary | ICD-10-CM | POA: Diagnosis not present

## 2021-01-06 MED ORDER — MOUNJARO 5 MG/0.5ML ~~LOC~~ SOAJ
SUBCUTANEOUS | 0 refills | Status: DC
  Filled 2021-01-06 – 2021-01-18 (×2): qty 2, 28d supply, fill #0

## 2021-01-15 ENCOUNTER — Other Ambulatory Visit (HOSPITAL_BASED_OUTPATIENT_CLINIC_OR_DEPARTMENT_OTHER): Payer: Self-pay

## 2021-01-18 ENCOUNTER — Other Ambulatory Visit (HOSPITAL_BASED_OUTPATIENT_CLINIC_OR_DEPARTMENT_OTHER): Payer: Self-pay

## 2021-01-27 ENCOUNTER — Other Ambulatory Visit (HOSPITAL_BASED_OUTPATIENT_CLINIC_OR_DEPARTMENT_OTHER): Payer: Self-pay

## 2021-01-28 ENCOUNTER — Other Ambulatory Visit (HOSPITAL_BASED_OUTPATIENT_CLINIC_OR_DEPARTMENT_OTHER): Payer: Self-pay

## 2021-01-29 ENCOUNTER — Other Ambulatory Visit (HOSPITAL_BASED_OUTPATIENT_CLINIC_OR_DEPARTMENT_OTHER): Payer: Self-pay

## 2021-01-29 MED ORDER — FREESTYLE LITE TEST VI STRP
ORAL_STRIP | 1 refills | Status: AC
  Filled 2021-01-29: qty 200, 90d supply, fill #0

## 2021-01-29 MED ORDER — FREESTYLE LANCETS MISC
0 refills | Status: AC
  Filled 2021-01-29: qty 200, 90d supply, fill #0

## 2021-01-29 MED ORDER — FIFTY50 GLUCOSE METER 2.0 W/DEVICE KIT
PACK | 0 refills | Status: AC
  Filled 2021-01-29: qty 1, 30d supply, fill #0

## 2021-02-01 ENCOUNTER — Other Ambulatory Visit (HOSPITAL_BASED_OUTPATIENT_CLINIC_OR_DEPARTMENT_OTHER): Payer: Self-pay

## 2021-02-01 MED ORDER — ASSURE COMFORT LANCETS 28G MISC
2 refills | Status: AC
  Filled 2021-02-01: qty 200, 90d supply, fill #0

## 2021-02-16 ENCOUNTER — Other Ambulatory Visit (HOSPITAL_BASED_OUTPATIENT_CLINIC_OR_DEPARTMENT_OTHER): Payer: Self-pay

## 2021-02-16 MED ORDER — GLIMEPIRIDE 4 MG PO TABS
ORAL_TABLET | ORAL | 3 refills | Status: DC
  Filled 2021-02-16: qty 90, 90d supply, fill #0
  Filled 2021-05-17: qty 90, 90d supply, fill #1

## 2021-02-16 MED ORDER — LISINOPRIL 20 MG PO TABS
20.0000 mg | ORAL_TABLET | Freq: Every day | ORAL | 1 refills | Status: DC
  Filled 2021-02-16: qty 90, 90d supply, fill #0
  Filled 2021-05-17: qty 90, 90d supply, fill #1

## 2021-02-20 DIAGNOSIS — N3000 Acute cystitis without hematuria: Secondary | ICD-10-CM | POA: Diagnosis not present

## 2021-02-20 DIAGNOSIS — R81 Glycosuria: Secondary | ICD-10-CM | POA: Diagnosis not present

## 2021-03-01 ENCOUNTER — Other Ambulatory Visit (HOSPITAL_BASED_OUTPATIENT_CLINIC_OR_DEPARTMENT_OTHER): Payer: Self-pay

## 2021-03-01 MED ORDER — MOUNJARO 7.5 MG/0.5ML ~~LOC~~ SOAJ
SUBCUTANEOUS | 0 refills | Status: DC
  Filled 2021-03-01: qty 2, 28d supply, fill #0

## 2021-03-21 DIAGNOSIS — Z20822 Contact with and (suspected) exposure to covid-19: Secondary | ICD-10-CM | POA: Diagnosis not present

## 2021-03-21 DIAGNOSIS — Z20828 Contact with and (suspected) exposure to other viral communicable diseases: Secondary | ICD-10-CM | POA: Diagnosis not present

## 2021-03-21 DIAGNOSIS — J209 Acute bronchitis, unspecified: Secondary | ICD-10-CM | POA: Diagnosis not present

## 2021-03-21 DIAGNOSIS — R519 Headache, unspecified: Secondary | ICD-10-CM | POA: Diagnosis not present

## 2021-03-30 ENCOUNTER — Other Ambulatory Visit (HOSPITAL_COMMUNITY): Payer: Self-pay

## 2021-03-30 ENCOUNTER — Other Ambulatory Visit (HOSPITAL_BASED_OUTPATIENT_CLINIC_OR_DEPARTMENT_OTHER): Payer: Self-pay

## 2021-03-30 MED ORDER — MOUNJARO 10 MG/0.5ML ~~LOC~~ SOAJ
SUBCUTANEOUS | 2 refills | Status: DC
  Filled 2021-03-30 (×2): qty 2, 28d supply, fill #0

## 2021-03-31 ENCOUNTER — Other Ambulatory Visit (HOSPITAL_COMMUNITY): Payer: Self-pay

## 2021-03-31 ENCOUNTER — Other Ambulatory Visit (HOSPITAL_BASED_OUTPATIENT_CLINIC_OR_DEPARTMENT_OTHER): Payer: Self-pay

## 2021-03-31 MED ORDER — MOUNJARO 7.5 MG/0.5ML ~~LOC~~ SOAJ
SUBCUTANEOUS | 3 refills | Status: DC
  Filled 2021-03-31: qty 6, 84d supply, fill #0
  Filled 2021-03-31: qty 2, 28d supply, fill #0

## 2021-04-02 ENCOUNTER — Other Ambulatory Visit (HOSPITAL_BASED_OUTPATIENT_CLINIC_OR_DEPARTMENT_OTHER): Payer: Self-pay

## 2021-04-06 ENCOUNTER — Other Ambulatory Visit (HOSPITAL_COMMUNITY): Payer: Self-pay

## 2021-04-08 ENCOUNTER — Other Ambulatory Visit (HOSPITAL_BASED_OUTPATIENT_CLINIC_OR_DEPARTMENT_OTHER): Payer: Self-pay

## 2021-04-08 MED ORDER — CARVEDILOL 12.5 MG PO TABS
25.0000 mg | ORAL_TABLET | Freq: Two times a day (BID) | ORAL | 0 refills | Status: DC
  Filled 2021-04-08: qty 120, 30d supply, fill #0

## 2021-04-12 ENCOUNTER — Other Ambulatory Visit (HOSPITAL_BASED_OUTPATIENT_CLINIC_OR_DEPARTMENT_OTHER): Payer: Self-pay

## 2021-04-12 DIAGNOSIS — H401112 Primary open-angle glaucoma, right eye, moderate stage: Secondary | ICD-10-CM | POA: Diagnosis not present

## 2021-04-12 DIAGNOSIS — H40002 Preglaucoma, unspecified, left eye: Secondary | ICD-10-CM | POA: Diagnosis not present

## 2021-04-12 MED ORDER — TIMOLOL MALEATE 0.5 % OP SOLN
OPHTHALMIC | 11 refills | Status: DC
  Filled 2021-04-12: qty 5, 90d supply, fill #0

## 2021-04-14 ENCOUNTER — Other Ambulatory Visit (HOSPITAL_BASED_OUTPATIENT_CLINIC_OR_DEPARTMENT_OTHER): Payer: Self-pay

## 2021-04-14 DIAGNOSIS — Z6834 Body mass index (BMI) 34.0-34.9, adult: Secondary | ICD-10-CM | POA: Diagnosis not present

## 2021-04-14 DIAGNOSIS — Z7985 Long-term (current) use of injectable non-insulin antidiabetic drugs: Secondary | ICD-10-CM | POA: Diagnosis not present

## 2021-04-14 DIAGNOSIS — E6609 Other obesity due to excess calories: Secondary | ICD-10-CM | POA: Diagnosis not present

## 2021-04-14 DIAGNOSIS — E119 Type 2 diabetes mellitus without complications: Secondary | ICD-10-CM | POA: Diagnosis not present

## 2021-04-14 MED ORDER — MOUNJARO 12.5 MG/0.5ML ~~LOC~~ SOAJ
SUBCUTANEOUS | 0 refills | Status: DC
  Filled 2021-04-14: qty 2, 28d supply, fill #0

## 2021-05-04 ENCOUNTER — Other Ambulatory Visit (HOSPITAL_BASED_OUTPATIENT_CLINIC_OR_DEPARTMENT_OTHER): Payer: Self-pay

## 2021-05-04 DIAGNOSIS — M79672 Pain in left foot: Secondary | ICD-10-CM | POA: Diagnosis not present

## 2021-05-04 DIAGNOSIS — E119 Type 2 diabetes mellitus without complications: Secondary | ICD-10-CM | POA: Diagnosis not present

## 2021-05-04 DIAGNOSIS — G5762 Lesion of plantar nerve, left lower limb: Secondary | ICD-10-CM | POA: Diagnosis not present

## 2021-05-04 MED ORDER — PREDNISONE 10 MG PO TABS
ORAL_TABLET | ORAL | 0 refills | Status: DC
  Filled 2021-05-04: qty 48, 12d supply, fill #0

## 2021-05-04 MED ORDER — MELOXICAM 15 MG PO TABS
ORAL_TABLET | ORAL | 2 refills | Status: DC
  Filled 2021-05-04: qty 30, 30d supply, fill #0

## 2021-05-06 ENCOUNTER — Other Ambulatory Visit (HOSPITAL_BASED_OUTPATIENT_CLINIC_OR_DEPARTMENT_OTHER): Payer: Self-pay

## 2021-05-14 ENCOUNTER — Other Ambulatory Visit (HOSPITAL_BASED_OUTPATIENT_CLINIC_OR_DEPARTMENT_OTHER): Payer: Self-pay

## 2021-05-14 DIAGNOSIS — E6609 Other obesity due to excess calories: Secondary | ICD-10-CM | POA: Diagnosis not present

## 2021-05-14 DIAGNOSIS — E782 Mixed hyperlipidemia: Secondary | ICD-10-CM | POA: Diagnosis not present

## 2021-05-14 DIAGNOSIS — I10 Essential (primary) hypertension: Secondary | ICD-10-CM | POA: Diagnosis not present

## 2021-05-14 DIAGNOSIS — Z6834 Body mass index (BMI) 34.0-34.9, adult: Secondary | ICD-10-CM | POA: Diagnosis not present

## 2021-05-14 DIAGNOSIS — E1165 Type 2 diabetes mellitus with hyperglycemia: Secondary | ICD-10-CM | POA: Diagnosis not present

## 2021-05-14 DIAGNOSIS — E559 Vitamin D deficiency, unspecified: Secondary | ICD-10-CM | POA: Diagnosis not present

## 2021-05-14 MED ORDER — ERGOCALCIFEROL 1.25 MG (50000 UT) PO CAPS
ORAL_CAPSULE | ORAL | 3 refills | Status: AC
  Filled 2021-05-14: qty 12, 84d supply, fill #0
  Filled 2021-08-18: qty 12, 84d supply, fill #1

## 2021-05-14 MED ORDER — CARVEDILOL 12.5 MG PO TABS
ORAL_TABLET | ORAL | 3 refills | Status: DC
  Filled 2021-05-14 – 2021-06-11 (×3): qty 360, 90d supply, fill #0
  Filled 2021-09-15: qty 360, 90d supply, fill #1

## 2021-05-17 ENCOUNTER — Other Ambulatory Visit (HOSPITAL_BASED_OUTPATIENT_CLINIC_OR_DEPARTMENT_OTHER): Payer: Self-pay

## 2021-05-25 ENCOUNTER — Other Ambulatory Visit (HOSPITAL_BASED_OUTPATIENT_CLINIC_OR_DEPARTMENT_OTHER): Payer: Self-pay

## 2021-05-25 MED ORDER — MOUNJARO 15 MG/0.5ML ~~LOC~~ SOAJ
SUBCUTANEOUS | 3 refills | Status: DC
  Filled 2021-05-25: qty 2, 28d supply, fill #0
  Filled 2021-06-22: qty 2, 28d supply, fill #1
  Filled 2021-07-22: qty 2, 28d supply, fill #2
  Filled 2021-08-18: qty 2, 28d supply, fill #3

## 2021-05-26 ENCOUNTER — Other Ambulatory Visit (HOSPITAL_BASED_OUTPATIENT_CLINIC_OR_DEPARTMENT_OTHER): Payer: Self-pay

## 2021-06-09 DIAGNOSIS — I10 Essential (primary) hypertension: Secondary | ICD-10-CM | POA: Diagnosis not present

## 2021-06-09 DIAGNOSIS — Z23 Encounter for immunization: Secondary | ICD-10-CM | POA: Diagnosis not present

## 2021-06-09 DIAGNOSIS — E6609 Other obesity due to excess calories: Secondary | ICD-10-CM | POA: Diagnosis not present

## 2021-06-09 DIAGNOSIS — Z6834 Body mass index (BMI) 34.0-34.9, adult: Secondary | ICD-10-CM | POA: Diagnosis not present

## 2021-06-09 DIAGNOSIS — E782 Mixed hyperlipidemia: Secondary | ICD-10-CM | POA: Diagnosis not present

## 2021-06-09 DIAGNOSIS — E1165 Type 2 diabetes mellitus with hyperglycemia: Secondary | ICD-10-CM | POA: Diagnosis not present

## 2021-06-09 DIAGNOSIS — Z0000 Encounter for general adult medical examination without abnormal findings: Secondary | ICD-10-CM | POA: Diagnosis not present

## 2021-06-11 ENCOUNTER — Other Ambulatory Visit (HOSPITAL_BASED_OUTPATIENT_CLINIC_OR_DEPARTMENT_OTHER): Payer: Self-pay

## 2021-06-22 ENCOUNTER — Other Ambulatory Visit (HOSPITAL_BASED_OUTPATIENT_CLINIC_OR_DEPARTMENT_OTHER): Payer: Self-pay

## 2021-07-08 ENCOUNTER — Other Ambulatory Visit (HOSPITAL_BASED_OUTPATIENT_CLINIC_OR_DEPARTMENT_OTHER): Payer: Self-pay

## 2021-07-13 ENCOUNTER — Other Ambulatory Visit (HOSPITAL_BASED_OUTPATIENT_CLINIC_OR_DEPARTMENT_OTHER): Payer: Self-pay

## 2021-07-13 MED ORDER — TRIAMCINOLONE ACETONIDE 0.1 % EX CREA
TOPICAL_CREAM | CUTANEOUS | 0 refills | Status: DC
Start: 2021-07-13 — End: 2022-07-04
  Filled 2021-07-13: qty 15, 14d supply, fill #0

## 2021-07-22 ENCOUNTER — Other Ambulatory Visit (HOSPITAL_BASED_OUTPATIENT_CLINIC_OR_DEPARTMENT_OTHER): Payer: Self-pay

## 2021-08-18 ENCOUNTER — Other Ambulatory Visit (HOSPITAL_BASED_OUTPATIENT_CLINIC_OR_DEPARTMENT_OTHER): Payer: Self-pay

## 2021-08-18 DIAGNOSIS — Z20822 Contact with and (suspected) exposure to covid-19: Secondary | ICD-10-CM | POA: Diagnosis not present

## 2021-08-18 DIAGNOSIS — J029 Acute pharyngitis, unspecified: Secondary | ICD-10-CM | POA: Diagnosis not present

## 2021-08-18 MED ORDER — LIDOCAINE VISCOUS HCL 2 % MT SOLN
OROMUCOSAL | 1 refills | Status: DC
Start: 2021-08-18 — End: 2021-12-10
  Filled 2021-08-18: qty 100, 30d supply, fill #0

## 2021-08-18 MED ORDER — LISINOPRIL 20 MG PO TABS
20.0000 mg | ORAL_TABLET | Freq: Every day | ORAL | 1 refills | Status: DC
  Filled 2021-08-18: qty 90, 90d supply, fill #0
  Filled 2022-02-18 (×2): qty 90, 90d supply, fill #1

## 2021-08-19 ENCOUNTER — Other Ambulatory Visit (HOSPITAL_BASED_OUTPATIENT_CLINIC_OR_DEPARTMENT_OTHER): Payer: Self-pay

## 2021-08-20 ENCOUNTER — Other Ambulatory Visit (HOSPITAL_BASED_OUTPATIENT_CLINIC_OR_DEPARTMENT_OTHER): Payer: Self-pay

## 2021-08-20 MED ORDER — MOUNJARO 12.5 MG/0.5ML ~~LOC~~ SOAJ
SUBCUTANEOUS | 1 refills | Status: DC
  Filled 2021-08-20: qty 2, 28d supply, fill #0
  Filled 2021-09-15: qty 2, 28d supply, fill #1

## 2021-08-25 DIAGNOSIS — E119 Type 2 diabetes mellitus without complications: Secondary | ICD-10-CM | POA: Diagnosis not present

## 2021-08-25 DIAGNOSIS — H401212 Low-tension glaucoma, right eye, moderate stage: Secondary | ICD-10-CM | POA: Diagnosis not present

## 2021-09-15 ENCOUNTER — Other Ambulatory Visit (HOSPITAL_BASED_OUTPATIENT_CLINIC_OR_DEPARTMENT_OTHER): Payer: Self-pay

## 2021-10-06 DIAGNOSIS — Z7984 Long term (current) use of oral hypoglycemic drugs: Secondary | ICD-10-CM | POA: Diagnosis not present

## 2021-10-06 DIAGNOSIS — Z7985 Long-term (current) use of injectable non-insulin antidiabetic drugs: Secondary | ICD-10-CM | POA: Diagnosis not present

## 2021-10-06 DIAGNOSIS — E119 Type 2 diabetes mellitus without complications: Secondary | ICD-10-CM | POA: Diagnosis not present

## 2021-10-08 ENCOUNTER — Other Ambulatory Visit (HOSPITAL_BASED_OUTPATIENT_CLINIC_OR_DEPARTMENT_OTHER): Payer: Self-pay

## 2021-10-08 MED ORDER — MOUNJARO 15 MG/0.5ML ~~LOC~~ SOAJ
SUBCUTANEOUS | 3 refills | Status: DC
  Filled 2021-10-08: qty 2, 28d supply, fill #0
  Filled 2022-03-28: qty 2, 28d supply, fill #1

## 2021-10-11 DIAGNOSIS — H401112 Primary open-angle glaucoma, right eye, moderate stage: Secondary | ICD-10-CM | POA: Diagnosis not present

## 2021-10-11 DIAGNOSIS — H40002 Preglaucoma, unspecified, left eye: Secondary | ICD-10-CM | POA: Diagnosis not present

## 2021-10-27 ENCOUNTER — Other Ambulatory Visit (HOSPITAL_BASED_OUTPATIENT_CLINIC_OR_DEPARTMENT_OTHER): Payer: Self-pay

## 2021-11-01 ENCOUNTER — Other Ambulatory Visit (HOSPITAL_BASED_OUTPATIENT_CLINIC_OR_DEPARTMENT_OTHER): Payer: Self-pay

## 2021-11-01 MED ORDER — MOUNJARO 15 MG/0.5ML ~~LOC~~ SOAJ
SUBCUTANEOUS | 3 refills | Status: DC
  Filled 2021-11-01: qty 2, 28d supply, fill #0
  Filled 2021-12-01: qty 2, 28d supply, fill #1
  Filled 2021-12-27: qty 2, 28d supply, fill #2
  Filled 2022-01-28: qty 2, 28d supply, fill #3

## 2021-11-01 MED ORDER — LISINOPRIL 20 MG PO TABS
20.0000 mg | ORAL_TABLET | Freq: Every day | ORAL | 1 refills | Status: DC
  Filled 2021-11-01: qty 90, 90d supply, fill #0
  Filled 2022-05-27: qty 90, 90d supply, fill #1

## 2021-11-02 ENCOUNTER — Other Ambulatory Visit (HOSPITAL_BASED_OUTPATIENT_CLINIC_OR_DEPARTMENT_OTHER): Payer: Self-pay

## 2021-11-02 DIAGNOSIS — Z5181 Encounter for therapeutic drug level monitoring: Secondary | ICD-10-CM | POA: Diagnosis not present

## 2021-11-02 DIAGNOSIS — L732 Hidradenitis suppurativa: Secondary | ICD-10-CM | POA: Diagnosis not present

## 2021-11-02 MED ORDER — SPIRONOLACTONE 50 MG PO TABS
50.0000 mg | ORAL_TABLET | Freq: Every day | ORAL | 5 refills | Status: DC
  Filled 2021-11-02: qty 30, 30d supply, fill #0
  Filled 2021-12-03: qty 30, 30d supply, fill #1
  Filled 2022-01-06: qty 30, 30d supply, fill #2
  Filled 2022-02-01: qty 30, 30d supply, fill #3
  Filled 2022-03-16: qty 30, 30d supply, fill #4
  Filled 2022-04-20: qty 30, 30d supply, fill #5

## 2021-11-02 MED ORDER — CLINDAMYCIN PHOSPHATE 1 % EX SOLN
CUTANEOUS | 3 refills | Status: DC
  Filled 2021-11-02: qty 60, 30d supply, fill #0

## 2021-11-22 ENCOUNTER — Other Ambulatory Visit (HOSPITAL_COMMUNITY): Payer: Self-pay

## 2021-11-22 MED ORDER — HUMIRA-CD/UC/HS STARTER 80 MG/0.8ML ~~LOC~~ AJKT: AUTO-INJECTOR | SUBCUTANEOUS | 0 refills | Status: DC

## 2021-11-22 MED ORDER — HUMIRA (2 PEN) 40 MG/0.4ML ~~LOC~~ AJKT: AUTO-INJECTOR | SUBCUTANEOUS | 5 refills | Status: DC

## 2021-11-23 ENCOUNTER — Other Ambulatory Visit (HOSPITAL_COMMUNITY): Payer: Self-pay

## 2021-11-23 ENCOUNTER — Ambulatory Visit: Payer: 59 | Attending: Internal Medicine | Admitting: Pharmacist

## 2021-11-23 DIAGNOSIS — Z7189 Other specified counseling: Secondary | ICD-10-CM

## 2021-11-23 MED ORDER — HUMIRA-CD/UC/HS STARTER 80 MG/0.8ML ~~LOC~~ AJKT
AUTO-INJECTOR | SUBCUTANEOUS | 0 refills | Status: DC
  Filled 2021-11-23: qty 3, 28d supply, fill #0

## 2021-11-23 MED ORDER — HUMIRA (2 PEN) 40 MG/0.4ML ~~LOC~~ AJKT
AUTO-INJECTOR | SUBCUTANEOUS | 5 refills | Status: DC
  Filled 2021-11-23: qty 4, fill #0

## 2021-11-23 NOTE — Progress Notes (Signed)
  S: Patient presents for review of their specialty medication therapy.  Patient is about to start taking Humira for HS. Patient is managed by Dr. Laure Kidney for this.   Adherence: has not started   Efficacy: has not started  Dosing: Hidradenitis suppurativa (Humira only): SubQ: Initial: 160 mg (given as four 40 mg injections on day 1 or given as two 40 mg injections per day over 2 consecutive days), then 80 mg 2 weeks later (day 15). Maintenance: 40 mg every week beginning day 29.  Dose adjustments: Renal: no dose adjustments (has not been studied) Hepatic: no dose adjustments (has not been studied)  Drug-drug interactions: none identified   Screening: TB test: completed  Hepatitis: completed  *Per CareEverywhere  Monitoring: S/sx of infection: none CBC: nl (04/2021) S/sx of hypersensitivity: none S/sx of malignancy: none S/sx of heart failure: none  Other side effects: none  O:     Lab Results  Component Value Date   WBC 6.1 07/20/2017   HGB 11.2 (L) 07/20/2017   HCT 34.3 (L) 07/20/2017   MCV 80.0 07/20/2017   PLT 215 07/20/2017      Chemistry      Component Value Date/Time   NA 138 07/20/2017 1457   K 3.4 (L) 07/20/2017 1457   CL 108 07/20/2017 1457   CO2 23 07/20/2017 1457   BUN 11 07/20/2017 1457   CREATININE 0.71 07/20/2017 1457   CREATININE 0.76 07/13/2012 0953      Component Value Date/Time   CALCIUM 8.8 (L) 07/20/2017 1457   ALKPHOS 73 07/20/2017 1457   AST 17 07/20/2017 1457   ALT 15 07/20/2017 1457   BILITOT 0.6 07/20/2017 1457       A/P: 1. Medication review: Patient is about to start on Humira for HS. Reviewed the medication with the patient, including the following: Humira is a TNF blocking agent indicated for ankylosing spondylitis, Crohn's disease, Hidradenitis suppurativa, psoriatic arthritis, plaque psoriasis, ulcerative colitis, and uveitis. Patient educated on purpose, proper use and potential adverse effects of Humira.  Possible adverse effects are increased risk of infections, headache, and injection site reactions. There is the possibility of an increased risk of malignancy but it is not well understood if this increased risk is due to there medication or the disease state. There are rare cases of pancytopenia and aplastic anemia. For SubQ injection at separate sites in the thigh or lower abdomen (avoiding areas within 2 inches of navel); rotate injection sites. May leave at room temperature for ~15 to 30 minutes prior to use; do not remove cap or cover while allowing product to reach room temperature. Do not use if solution is discolored or contains particulate matter. Do not administer to skin which is red, tender, bruised, hard, or that has scars, stretch marks, or psoriasis plaques. Needle cap of the prefilled syringe or needle cover for the adalimumab pen may contain latex. Prefilled pens and syringes are available for use by patients and the full amount of the syringe should be injected (self-administration); the vial is intended for institutional use only. Vials do not contain a preservative; discard unused portion. No recommendations for any changes at this time.   Benard Halsted, PharmD, [facility], [place] [phone]

## 2021-11-24 ENCOUNTER — Other Ambulatory Visit (HOSPITAL_COMMUNITY): Payer: Self-pay

## 2021-11-25 ENCOUNTER — Other Ambulatory Visit (HOSPITAL_COMMUNITY): Payer: Self-pay

## 2021-11-25 ENCOUNTER — Other Ambulatory Visit: Payer: Self-pay | Admitting: Pharmacist

## 2021-11-25 MED ORDER — HUMIRA-CD/UC/HS STARTER 40 MG/0.8ML ~~LOC~~ PNKT: PEN_INJECTOR | SUBCUTANEOUS | 0 refills | Status: DC

## 2021-11-25 MED ORDER — HUMIRA PEN 40 MG/0.8ML ~~LOC~~ PNKT: PEN_INJECTOR | SUBCUTANEOUS | 3 refills | Status: DC

## 2021-11-25 MED ORDER — HUMIRA PEN 40 MG/0.8ML ~~LOC~~ PNKT
PEN_INJECTOR | SUBCUTANEOUS | 3 refills | Status: DC
  Filled 2021-11-25: qty 2, fill #0
  Filled 2021-12-14: qty 4, 28d supply, fill #0
  Filled 2022-01-12: qty 4, 28d supply, fill #1
  Filled 2022-02-08: qty 4, 28d supply, fill #2

## 2021-11-25 MED ORDER — HUMIRA PEN 40 MG/0.8ML ~~LOC~~ PNKT
PEN_INJECTOR | SUBCUTANEOUS | 0 refills | Status: DC
  Filled 2021-11-25: qty 6, 28d supply, fill #0
  Filled 2021-11-25: qty 6, fill #0

## 2021-11-29 ENCOUNTER — Other Ambulatory Visit (HOSPITAL_COMMUNITY): Payer: Self-pay

## 2021-11-30 ENCOUNTER — Other Ambulatory Visit (HOSPITAL_BASED_OUTPATIENT_CLINIC_OR_DEPARTMENT_OTHER): Payer: Self-pay

## 2021-11-30 MED ORDER — VITAMIN D (ERGOCALCIFEROL) 1.25 MG (50000 UNIT) PO CAPS
ORAL_CAPSULE | ORAL | 3 refills | Status: DC
  Filled 2021-11-30: qty 12, 84d supply, fill #0
  Filled 2022-02-01 – 2022-02-18 (×3): qty 12, 84d supply, fill #1
  Filled 2022-05-27: qty 12, 84d supply, fill #2

## 2021-11-30 MED ORDER — TIMOLOL MALEATE 0.5 % OP SOLN
OPHTHALMIC | 11 refills | Status: DC
  Filled 2021-11-30: qty 5, 90d supply, fill #0
  Filled 2022-01-06: qty 5, 90d supply, fill #1
  Filled 2022-05-27: qty 5, 90d supply, fill #2

## 2021-12-01 ENCOUNTER — Other Ambulatory Visit (HOSPITAL_BASED_OUTPATIENT_CLINIC_OR_DEPARTMENT_OTHER): Payer: Self-pay

## 2021-12-01 MED ORDER — MOUNJARO 15 MG/0.5ML ~~LOC~~ SOAJ
15.0000 mg | SUBCUTANEOUS | 3 refills | Status: AC
  Filled 2021-12-01 – 2022-02-25 (×5): qty 2, 28d supply, fill #0
  Filled 2022-04-29: qty 2, 28d supply, fill #1
  Filled 2022-07-28 – 2022-10-25 (×2): qty 2, 28d supply, fill #2
  Filled 2022-11-28: qty 2, 28d supply, fill #3

## 2021-12-03 ENCOUNTER — Other Ambulatory Visit (HOSPITAL_BASED_OUTPATIENT_CLINIC_OR_DEPARTMENT_OTHER): Payer: Self-pay

## 2021-12-06 DIAGNOSIS — Z1231 Encounter for screening mammogram for malignant neoplasm of breast: Secondary | ICD-10-CM | POA: Diagnosis not present

## 2021-12-06 DIAGNOSIS — R92323 Mammographic fibroglandular density, bilateral breasts: Secondary | ICD-10-CM | POA: Diagnosis not present

## 2021-12-09 ENCOUNTER — Other Ambulatory Visit (HOSPITAL_COMMUNITY): Payer: Self-pay

## 2021-12-10 ENCOUNTER — Other Ambulatory Visit (HOSPITAL_BASED_OUTPATIENT_CLINIC_OR_DEPARTMENT_OTHER): Payer: Self-pay

## 2021-12-10 DIAGNOSIS — L719 Rosacea, unspecified: Secondary | ICD-10-CM | POA: Diagnosis not present

## 2021-12-10 DIAGNOSIS — E782 Mixed hyperlipidemia: Secondary | ICD-10-CM | POA: Diagnosis not present

## 2021-12-10 DIAGNOSIS — I10 Essential (primary) hypertension: Secondary | ICD-10-CM | POA: Diagnosis not present

## 2021-12-10 DIAGNOSIS — G43109 Migraine with aura, not intractable, without status migrainosus: Secondary | ICD-10-CM | POA: Diagnosis not present

## 2021-12-10 DIAGNOSIS — E1165 Type 2 diabetes mellitus with hyperglycemia: Secondary | ICD-10-CM | POA: Diagnosis not present

## 2021-12-10 MED ORDER — CARVEDILOL 6.25 MG PO TABS
3.2500 mg | ORAL_TABLET | Freq: Two times a day (BID) | ORAL | 11 refills | Status: DC
  Filled 2021-12-10 – 2022-02-18 (×3): qty 60, 60d supply, fill #0

## 2021-12-14 ENCOUNTER — Other Ambulatory Visit (HOSPITAL_COMMUNITY): Payer: Self-pay

## 2021-12-17 ENCOUNTER — Other Ambulatory Visit (HOSPITAL_BASED_OUTPATIENT_CLINIC_OR_DEPARTMENT_OTHER): Payer: Self-pay

## 2021-12-22 ENCOUNTER — Other Ambulatory Visit (HOSPITAL_COMMUNITY): Payer: Self-pay

## 2021-12-22 DIAGNOSIS — E119 Type 2 diabetes mellitus without complications: Secondary | ICD-10-CM | POA: Diagnosis not present

## 2021-12-22 DIAGNOSIS — Z01419 Encounter for gynecological examination (general) (routine) without abnormal findings: Secondary | ICD-10-CM | POA: Diagnosis not present

## 2021-12-22 DIAGNOSIS — H409 Unspecified glaucoma: Secondary | ICD-10-CM | POA: Diagnosis not present

## 2021-12-22 DIAGNOSIS — I10 Essential (primary) hypertension: Secondary | ICD-10-CM | POA: Diagnosis not present

## 2021-12-22 DIAGNOSIS — E7800 Pure hypercholesterolemia, unspecified: Secondary | ICD-10-CM | POA: Diagnosis not present

## 2021-12-27 ENCOUNTER — Other Ambulatory Visit (HOSPITAL_BASED_OUTPATIENT_CLINIC_OR_DEPARTMENT_OTHER): Payer: Self-pay

## 2021-12-30 DIAGNOSIS — Z1211 Encounter for screening for malignant neoplasm of colon: Secondary | ICD-10-CM | POA: Diagnosis not present

## 2021-12-30 DIAGNOSIS — Z1212 Encounter for screening for malignant neoplasm of rectum: Secondary | ICD-10-CM | POA: Diagnosis not present

## 2022-01-06 ENCOUNTER — Other Ambulatory Visit (HOSPITAL_BASED_OUTPATIENT_CLINIC_OR_DEPARTMENT_OTHER): Payer: Self-pay

## 2022-01-12 ENCOUNTER — Other Ambulatory Visit (HOSPITAL_COMMUNITY): Payer: Self-pay

## 2022-01-19 ENCOUNTER — Other Ambulatory Visit (HOSPITAL_COMMUNITY): Payer: Self-pay

## 2022-01-28 ENCOUNTER — Other Ambulatory Visit: Payer: Self-pay

## 2022-01-28 ENCOUNTER — Other Ambulatory Visit (HOSPITAL_BASED_OUTPATIENT_CLINIC_OR_DEPARTMENT_OTHER): Payer: Self-pay

## 2022-01-28 MED ORDER — ATORVASTATIN CALCIUM 40 MG PO TABS
40.0000 mg | ORAL_TABLET | Freq: Every day | ORAL | 3 refills | Status: DC
  Filled 2022-01-28: qty 90, 90d supply, fill #0
  Filled 2022-04-29 (×2): qty 90, 90d supply, fill #1
  Filled 2022-07-28: qty 90, 90d supply, fill #2
  Filled 2022-11-10: qty 90, 90d supply, fill #3

## 2022-02-01 ENCOUNTER — Other Ambulatory Visit (HOSPITAL_BASED_OUTPATIENT_CLINIC_OR_DEPARTMENT_OTHER): Payer: Self-pay

## 2022-02-04 ENCOUNTER — Other Ambulatory Visit: Payer: Self-pay

## 2022-02-08 ENCOUNTER — Other Ambulatory Visit (HOSPITAL_COMMUNITY): Payer: Self-pay

## 2022-02-15 ENCOUNTER — Other Ambulatory Visit (HOSPITAL_COMMUNITY): Payer: Self-pay

## 2022-02-16 ENCOUNTER — Other Ambulatory Visit (HOSPITAL_COMMUNITY): Payer: Self-pay

## 2022-02-16 ENCOUNTER — Other Ambulatory Visit: Payer: Self-pay

## 2022-02-18 ENCOUNTER — Other Ambulatory Visit (HOSPITAL_BASED_OUTPATIENT_CLINIC_OR_DEPARTMENT_OTHER): Payer: Self-pay

## 2022-02-22 ENCOUNTER — Other Ambulatory Visit (HOSPITAL_BASED_OUTPATIENT_CLINIC_OR_DEPARTMENT_OTHER): Payer: Self-pay

## 2022-02-22 ENCOUNTER — Other Ambulatory Visit: Payer: Self-pay

## 2022-02-23 ENCOUNTER — Other Ambulatory Visit (HOSPITAL_BASED_OUTPATIENT_CLINIC_OR_DEPARTMENT_OTHER): Payer: Self-pay

## 2022-02-25 ENCOUNTER — Other Ambulatory Visit (HOSPITAL_BASED_OUTPATIENT_CLINIC_OR_DEPARTMENT_OTHER): Payer: Self-pay

## 2022-03-03 DIAGNOSIS — J159 Unspecified bacterial pneumonia: Secondary | ICD-10-CM | POA: Diagnosis not present

## 2022-03-08 ENCOUNTER — Other Ambulatory Visit (HOSPITAL_COMMUNITY): Payer: Self-pay

## 2022-03-08 MED ORDER — HUMIRA (2 PEN) 40 MG/0.8ML ~~LOC~~ AJKT
AUTO-INJECTOR | SUBCUTANEOUS | 0 refills | Status: DC
  Filled 2022-03-11: qty 4, 28d supply, fill #0

## 2022-03-09 ENCOUNTER — Other Ambulatory Visit (HOSPITAL_COMMUNITY): Payer: Self-pay

## 2022-03-11 ENCOUNTER — Other Ambulatory Visit (HOSPITAL_COMMUNITY): Payer: Self-pay

## 2022-03-11 ENCOUNTER — Other Ambulatory Visit: Payer: Self-pay

## 2022-03-15 ENCOUNTER — Other Ambulatory Visit (HOSPITAL_COMMUNITY): Payer: Self-pay

## 2022-03-15 ENCOUNTER — Other Ambulatory Visit: Payer: Self-pay

## 2022-03-16 ENCOUNTER — Other Ambulatory Visit (HOSPITAL_BASED_OUTPATIENT_CLINIC_OR_DEPARTMENT_OTHER): Payer: Self-pay

## 2022-03-28 ENCOUNTER — Other Ambulatory Visit (HOSPITAL_BASED_OUTPATIENT_CLINIC_OR_DEPARTMENT_OTHER): Payer: Self-pay

## 2022-04-13 ENCOUNTER — Other Ambulatory Visit (HOSPITAL_COMMUNITY): Payer: Self-pay

## 2022-04-20 ENCOUNTER — Other Ambulatory Visit (HOSPITAL_BASED_OUTPATIENT_CLINIC_OR_DEPARTMENT_OTHER): Payer: Self-pay

## 2022-04-26 ENCOUNTER — Other Ambulatory Visit (HOSPITAL_COMMUNITY): Payer: Self-pay

## 2022-04-29 ENCOUNTER — Other Ambulatory Visit (HOSPITAL_BASED_OUTPATIENT_CLINIC_OR_DEPARTMENT_OTHER): Payer: Self-pay

## 2022-04-29 ENCOUNTER — Other Ambulatory Visit: Payer: Self-pay | Admitting: Pharmacist

## 2022-04-29 ENCOUNTER — Other Ambulatory Visit: Payer: Self-pay

## 2022-04-29 ENCOUNTER — Other Ambulatory Visit (HOSPITAL_COMMUNITY): Payer: Self-pay

## 2022-04-29 MED ORDER — HUMIRA (2 PEN) 40 MG/0.8ML ~~LOC~~ AJKT: AUTO-INJECTOR | SUBCUTANEOUS | 3 refills | Status: DC

## 2022-04-29 MED ORDER — HUMIRA (2 PEN) 40 MG/0.8ML ~~LOC~~ AJKT
AUTO-INJECTOR | SUBCUTANEOUS | 1 refills | Status: DC
  Filled 2022-04-29: qty 4, 28d supply, fill #0

## 2022-04-29 MED ORDER — HUMIRA (2 PEN) 40 MG/0.8ML ~~LOC~~ AJKT: AUTO-INJECTOR | SUBCUTANEOUS | 1 refills | Status: DC

## 2022-05-02 ENCOUNTER — Other Ambulatory Visit: Payer: Self-pay

## 2022-05-03 ENCOUNTER — Other Ambulatory Visit (HOSPITAL_COMMUNITY): Payer: Self-pay

## 2022-05-10 ENCOUNTER — Other Ambulatory Visit (HOSPITAL_BASED_OUTPATIENT_CLINIC_OR_DEPARTMENT_OTHER): Payer: Self-pay

## 2022-05-10 DIAGNOSIS — L732 Hidradenitis suppurativa: Secondary | ICD-10-CM | POA: Diagnosis not present

## 2022-05-10 DIAGNOSIS — M25542 Pain in joints of left hand: Secondary | ICD-10-CM | POA: Diagnosis not present

## 2022-05-10 DIAGNOSIS — Z5181 Encounter for therapeutic drug level monitoring: Secondary | ICD-10-CM | POA: Diagnosis not present

## 2022-05-10 DIAGNOSIS — M25541 Pain in joints of right hand: Secondary | ICD-10-CM | POA: Diagnosis not present

## 2022-05-10 MED ORDER — SPIRONOLACTONE 50 MG PO TABS
50.0000 mg | ORAL_TABLET | Freq: Every day | ORAL | 6 refills | Status: AC
  Filled 2022-05-10 – 2022-05-27 (×2): qty 30, 30d supply, fill #0
  Filled 2022-06-23: qty 30, 30d supply, fill #1
  Filled 2022-07-28: qty 30, 30d supply, fill #2
  Filled 2022-08-26: qty 30, 30d supply, fill #3
  Filled 2022-09-29: qty 30, 30d supply, fill #4
  Filled 2022-11-10: qty 30, 30d supply, fill #5
  Filled 2023-04-26: qty 30, 30d supply, fill #6

## 2022-05-10 MED ORDER — CLINDAMYCIN HCL 300 MG PO CAPS
300.0000 mg | ORAL_CAPSULE | Freq: Two times a day (BID) | ORAL | 0 refills | Status: AC
  Filled 2022-05-10: qty 28, 14d supply, fill #0

## 2022-05-12 ENCOUNTER — Other Ambulatory Visit: Payer: Self-pay

## 2022-05-12 ENCOUNTER — Other Ambulatory Visit (HOSPITAL_BASED_OUTPATIENT_CLINIC_OR_DEPARTMENT_OTHER): Payer: Self-pay

## 2022-05-12 MED ORDER — DAPSONE 25 MG PO TABS
50.0000 mg | ORAL_TABLET | Freq: Every day | ORAL | 0 refills | Status: DC
  Filled 2022-05-12: qty 60, 30d supply, fill #0

## 2022-05-25 ENCOUNTER — Other Ambulatory Visit (HOSPITAL_COMMUNITY): Payer: Self-pay

## 2022-05-27 ENCOUNTER — Other Ambulatory Visit (HOSPITAL_BASED_OUTPATIENT_CLINIC_OR_DEPARTMENT_OTHER): Payer: Self-pay

## 2022-05-31 ENCOUNTER — Other Ambulatory Visit (HOSPITAL_BASED_OUTPATIENT_CLINIC_OR_DEPARTMENT_OTHER): Payer: Self-pay

## 2022-05-31 MED ORDER — MOUNJARO 10 MG/0.5ML ~~LOC~~ SOAJ
10.0000 mg | SUBCUTANEOUS | 0 refills | Status: DC
  Filled 2022-05-31: qty 2, 28d supply, fill #0
  Filled 2022-06-23: qty 2, 28d supply, fill #1

## 2022-06-17 ENCOUNTER — Other Ambulatory Visit (HOSPITAL_BASED_OUTPATIENT_CLINIC_OR_DEPARTMENT_OTHER): Payer: Self-pay

## 2022-06-17 MED ORDER — BD SHARPS COLLECTOR MISC
11 refills | Status: AC
  Filled 2022-06-17: qty 1, 30d supply, fill #0

## 2022-06-21 ENCOUNTER — Encounter (HOSPITAL_BASED_OUTPATIENT_CLINIC_OR_DEPARTMENT_OTHER): Payer: Self-pay | Admitting: Pediatrics

## 2022-06-21 ENCOUNTER — Other Ambulatory Visit: Payer: Self-pay

## 2022-06-21 ENCOUNTER — Emergency Department (HOSPITAL_BASED_OUTPATIENT_CLINIC_OR_DEPARTMENT_OTHER): Payer: Commercial Managed Care - PPO

## 2022-06-21 ENCOUNTER — Emergency Department (HOSPITAL_BASED_OUTPATIENT_CLINIC_OR_DEPARTMENT_OTHER)
Admission: EM | Admit: 2022-06-21 | Discharge: 2022-06-21 | Disposition: A | Discharge status: Home / Self Care | Payer: Commercial Managed Care - PPO | Attending: Emergency Medicine | Admitting: Emergency Medicine

## 2022-06-21 DIAGNOSIS — I10 Essential (primary) hypertension: Secondary | ICD-10-CM | POA: Insufficient documentation

## 2022-06-21 DIAGNOSIS — R55 Syncope and collapse: Secondary | ICD-10-CM | POA: Insufficient documentation

## 2022-06-21 DIAGNOSIS — E119 Type 2 diabetes mellitus without complications: Secondary | ICD-10-CM | POA: Diagnosis not present

## 2022-06-21 DIAGNOSIS — I951 Orthostatic hypotension: Secondary | ICD-10-CM

## 2022-06-21 DIAGNOSIS — I959 Hypotension, unspecified: Secondary | ICD-10-CM | POA: Diagnosis not present

## 2022-06-21 DIAGNOSIS — Z79899 Other long term (current) drug therapy: Secondary | ICD-10-CM | POA: Diagnosis not present

## 2022-06-21 DIAGNOSIS — Z7984 Long term (current) use of oral hypoglycemic drugs: Secondary | ICD-10-CM | POA: Diagnosis not present

## 2022-06-21 DIAGNOSIS — Z043 Encounter for examination and observation following other accident: Secondary | ICD-10-CM | POA: Diagnosis not present

## 2022-06-21 LAB — URINALYSIS, ROUTINE W REFLEX MICROSCOPIC
Bilirubin Urine: NEGATIVE
Glucose, UA: NEGATIVE mg/dL
Hgb urine dipstick: NEGATIVE
Ketones, ur: NEGATIVE mg/dL
Leukocytes,Ua: NEGATIVE
Nitrite: NEGATIVE
Protein, ur: NEGATIVE mg/dL
Specific Gravity, Urine: 1.02 (ref 1.005–1.030)
pH: 7 (ref 5.0–8.0)

## 2022-06-21 LAB — CBC
HCT: 38.1 % (ref 36.0–46.0)
Hemoglobin: 12.4 g/dL (ref 12.0–15.0)
MCH: 30.1 pg (ref 26.0–34.0)
MCHC: 32.5 g/dL (ref 30.0–36.0)
MCV: 92.5 fL (ref 80.0–100.0)
Platelets: 193 10*3/uL (ref 150–400)
RBC: 4.12 MIL/uL (ref 3.87–5.11)
RDW: 12.7 % (ref 11.5–15.5)
WBC: 4.6 10*3/uL (ref 4.0–10.5)
nRBC: 0 % (ref 0.0–0.2)

## 2022-06-21 LAB — BASIC METABOLIC PANEL
Anion gap: 9 (ref 5–15)
BUN: 15 mg/dL (ref 6–20)
CO2: 25 mmol/L (ref 22–32)
Calcium: 9.2 mg/dL (ref 8.9–10.3)
Chloride: 102 mmol/L (ref 98–111)
Creatinine, Ser: 0.89 mg/dL (ref 0.44–1.00)
GFR, Estimated: >60 mL/min (ref >60)
Glucose, Bld: 103 mg/dL — ABNORMAL HIGH (ref 70–99)
Potassium: 3.5 mmol/L (ref 3.5–5.1)
Sodium: 136 mmol/L (ref 135–145)

## 2022-06-21 LAB — GLUCOSE, CAPILLARY: Glucose-Capillary: 107 mg/dL — ABNORMAL HIGH (ref 70–99)

## 2022-06-21 NOTE — ED Notes (Signed)
Pt laid flat at 9:13 for orthostatics

## 2022-06-21 NOTE — ED Provider Notes (Signed)
 [place] [facility] Provider Note   CSN: [id number] Arrival date & time: 06/21/22  1657     History Chief Complaint  Patient presents with   Fall   Loss of Consciousness    Becky Simmons is a 52 y.o. female.  Patient with past medical history significant for hypertension, type 2 diabetes presents to the emergency room with complaints of loss of consciousness.  She reports that she was walking towards her toilet earlier tonight when she had a brief episode of loss of consciousness.  She states she was only unconscious for a few seconds but does report that she hit the right side of her head against the toilet.  Denies any headaches, nausea, vomiting since this accident.  No active bleeding or any lacerations.  Reports that she has previously had transient episodes of weakness and lightheadedness when she stands quickly from laying flat or standing up.  No known history of any BPPV, POTS, other acute causes of potential weakness, lightheadedness, syncope.  Patient does report that she has been taking Mounjaro for a period of time and was recently told to stop taking her blood pressure medication as her blood pressure has been dropping steadily as she has been losing weight with the Mounjaro.  She denies any other recent or significant side effects to the [facility]   Fall  Loss of Consciousness      Home Medications Prior to Admission medications   Medication Sig Start Date End Date Taking? Authorizing Provider  Adalimumab (HUMIRA, 2 PEN,) 40 MG/0.8ML PNKT Inject 0.8 mL (40 mg total) under the skin every 7 days. 04/29/22   Quentin Angst, MD  Assure Comfort Lancets 28G MISC USE AS DIRECTED TWICE DAILY 02/01/21     atorvastatin (LIPITOR) 40 MG tablet TAKE 1 TABLET (40 MG TOTAL) BY MOUTH DAILY. 10/16/19 12/08/20  Laqueta Due., MD  atorvastatin (LIPITOR) 40 MG tablet TAKE 1 TABLET (40 MG TOTAL) BY MOUTH DAILY. 01/28/22     Blood Glucose  Monitoring Suppl (FIFTY50 GLUCOSE METER 2.0) w/Device KIT Use as instructed to check blood sugar 01/29/21     carvedilol (COREG) 12.5 MG tablet Take 12.5 mg by mouth 2 (two) times daily with a meal.    [provider]  carvedilol (COREG) 12.5 MG tablet TAKE 2 TABLETS (25 MG TOTAL) BY MOUTH 2 TIMES DAILY. 03/11/20 03/11/21  Laqueta Due., MD  carvedilol (COREG) 12.5 MG tablet Take 2 tablets (25 mg total) by mouth 2 times daily. 05/14/21     carvedilol (COREG) 6.25 MG tablet Take 0.5 tablets (3.125 mg total) by mouth 2 (two) times daily. 12/10/21     Vitamin D, Ergocalciferol, (DRISDOL) 1.25 MG (50000 UNIT) CAPS capsule Take 1 capsule by mouth every 7 days 11/30/21     clindamycin (CLEOCIN T) 1 % external solution Apply to affected areas 1-2 times a day 11/02/21     cyclobenzaprine (FLEXERIL) 10 MG tablet Take 1 tablet (10 mg total) by mouth 2 (two) times daily as needed for muscle spasms. Patient not taking: Reported on 02/27/2017 01/29/16   Emi Holes, PA-C  dapsone 25 MG tablet Take 2 tablets (50 mg total) by mouth daily. 06/24/22     diazepam (VALIUM) 5 MG tablet Take 1 tablet (5 mg total) by mouth 2 (two) times daily. Patient not taking: Reported on 10/04/2017 07/12/17   Melene Plan, DO  Dulaglutide (TRULICITY) 0.75 MG/0.5ML SOPN Inject 0.75 mg into the skin once a  week.    [provider]  ergocalciferol (VITAMIN D2) 1.25 MG (50000 UT) capsule Take 1 capsule by mouth every 7 days 05/14/21   Laqueta Due., MD  gabapentin (NEURONTIN) 300 MG capsule Take 600 mg by mouth 2 (two) times daily. 06/23/17   [provider]  glimepiride (AMARYL) 4 MG tablet Take 1 tablet by mouth daily before breakfast 10/05/20     glimepiride (AMARYL) 4 MG tablet Take 1 tablet (4 mg total) by mouth daily before breakfast. 02/16/21     glucose blood (FREESTYLE LITE) test strip Use for blood sugar monitoring twice daily. 01/29/21     Lancets (FREESTYLE) lancets Use to check blood sugar twice daily 01/29/21    Younts, Jerolyn Center, NP  lisinopril (ZESTRIL) 20 MG tablet TAKE 1 TABLET (20 MG TOTAL) BY MOUTH DAILY. 05/31/19 05/30/20  Laqueta Due., MD  lisinopril (ZESTRIL) 20 MG tablet Take 1 tablet (20 mg total) by mouth daily. 08/21/20     lisinopril (ZESTRIL) 20 MG tablet Take 1 tablet (20 mg total) by mouth daily. 08/18/21     lisinopril (ZESTRIL) 20 MG tablet Take 1 tablet (20 mg total) by mouth daily. 11/01/21     meloxicam (MOBIC) 15 MG tablet Take 1 tablet by mouth daily 05/04/21     naproxen (NAPROSYN) 500 MG tablet Take 1 tablet (500 mg total) by mouth 2 (two) times daily. Patient not taking: Reported on 02/27/2017 01/29/16   Emi Holes, PA-C  pravastatin (PRAVACHOL) 10 MG tablet Take 10 mg by mouth daily.    [provider]  predniSONE (DELTASONE) 10 MG tablet On Days 1 - 4, take 2 tablets by mouth before breakfast, 1 tab after lunch, 1 after dinner and 2 tabs at bedtime. On days 5 -8, take a total of 4 tablets daily. 1 tab at breakfast, lunch,dinner and bedtime.  On days 9 -12, take 1 tab before breakfast and 1 tab at bedtime. 05/04/21     Sharps Container (BD SHARPS COLLECTOR) MISC Use as needed 06/17/22     sitaGLIPtin (JANUVIA) 100 MG tablet Take 100 mg by mouth daily.    [provider]  sitaGLIPtin (JANUVIA) 100 MG tablet TAKE 1 TABLET BY MOUTH DAILY 11/22/19 11/21/20  Laqueta Due., MD  spironolactone (ALDACTONE) 50 MG tablet Take 1 tablet (50 mg total) by mouth daily after a meal. 05/10/22     timolol (BETIMOL) 0.5 % ophthalmic solution Place 1 drop into the right eye daily.    [provider]  timolol (TIMOPTIC) 0.5 % ophthalmic solution Place 1 drop into the right eye daily. 10/01/20     timolol (TIMOPTIC) 0.5 % ophthalmic solution Place 1 drop into the right eye daily. 04/12/21     timolol (TIMOPTIC) 0.5 % ophthalmic solution Instill 1 drop into the right eye daily as directed 11/30/21     tirzepatide (MOUNJARO) 10 MG/0.5ML Pen Inject 10 mg into the skin once a week.  05/31/22     tirzepatide (MOUNJARO) 15 MG/0.5ML Pen Inject 15 mg into the skin every 7 days. 10/08/21     tirzepatide (MOUNJARO) 15 MG/0.5ML Pen Inject 15 mg into the skin every 7 days. 11/01/21     tirzepatide (MOUNJARO) 15 MG/0.5ML Pen Inject 15 mg into the skin once a week. 12/01/21     tirzepatide (MOUNJARO) 5 MG/0.5ML Pen Inject 5 mg into the skin every 7 days. 01/06/21     tirzepatide (MOUNJARO) 7.5 MG/0.5ML Pen Inject 7.5 mg into the skin  every 7 days. 03/31/21     traMADol (ULTRAM) 50 MG tablet Take 1 tablet (50 mg total) by mouth every 6 (six) hours as needed for severe pain. Patient not taking: Reported on 02/27/2017 01/29/16   Emi Holes, PA-C  triamcinolone cream (KENALOG) 0.1 % Apply a thin layer topically to affected area twice a day for up to 2 weeks. 07/13/21     Vitamin D, Ergocalciferol, (DRISDOL) 1.25 MG (50000 UNIT) CAPS capsule Take 1 capsule (50,000 Units total) by mouth every 7 days. 06/03/20     glipiZIDE (GLUCOTROL XL) 10 MG 24 hr tablet Take 1 tablet (10 mg total) by mouth daily. 06/03/20 10/05/20        Allergies    Invokana [canagliflozin] and Metformin and related    Review of Systems   Review of Systems  Cardiovascular:  Positive for syncope.  Neurological:  Positive for syncope.  All other systems reviewed and are negative.   Physical Exam Updated Vital Signs BP 107/64 (BP Location: Right Arm)   Pulse 81   Temp 98 F (36.7 C)   Resp (!) 22   Ht 5\' 10"  (1.778 m)   Wt 81.2 kg   LMP 06/05/2022   SpO2 96%   BMI 25.68 kg/m  Physical Exam Vitals and nursing note reviewed.  Constitutional:      General: She is not in acute distress.    Appearance: She is well-developed.  HENT:     Head: Normocephalic and atraumatic.  Eyes:     Conjunctiva/sclera: Conjunctivae normal.  Cardiovascular:     Rate and Rhythm: Normal rate and regular rhythm.     Heart sounds: No murmur heard. Pulmonary:     Effort: Pulmonary effort is normal. No respiratory distress.      Breath sounds: Normal breath sounds.  Abdominal:     Palpations: Abdomen is soft.     Tenderness: There is no abdominal tenderness.  Musculoskeletal:        General: No swelling.     Cervical back: Neck supple.  Skin:    General: Skin is warm and dry.     Capillary Refill: Capillary refill takes less than 2 seconds.  Neurological:     Mental Status: She is alert.  Psychiatric:        Mood and Affect: Mood normal.     ED Results / Procedures / Treatments   Labs (all labs ordered are listed, but only abnormal results are displayed) Labs Reviewed  BASIC METABOLIC PANEL - Abnormal; Notable for the following components:      Result Value   Glucose, Bld 103 (*)    All other components within normal limits  GLUCOSE, CAPILLARY - Abnormal; Notable for the following components:   Glucose-Capillary 107 (*)    All other components within normal limits  CBC  URINALYSIS, ROUTINE W REFLEX MICROSCOPIC  CBG MONITORING, ED    EKG EKG Interpretation  Date/Time:  Tuesday June 21 2022 17:19:20 EDT Ventricular Rate:  79 PR Interval:  139 QRS Duration: 98 QT Interval:  378 QTC Calculation: 434 R Axis:   81 Text Interpretation: Sinus rhythm Borderline T wave abnormalities Confirmed by Lockie Mola, Adam ([id number]) on 06/22/2022 8:20:14 AM  Radiology No results found.  Procedures Procedures   Medications Ordered in ED Medications - No data to display  ED Course/ Medical Decision Making/ A&P  Medical Decision Making Amount and/or Complexity of Data Reviewed Labs: ordered. Radiology: ordered.   This patient presents to the ED for concern of loss of consciousness.  Differential diagnosis includes vasovagal syncope, POTS, BPPV, dehydration   Lab Tests:  I Ordered, and personally interpreted labs.  The pertinent results include: CBC unremarkable, BMP unremarkable, mild hyperglycemia noted with point-of-care glucose at 107, pending UA   Imaging Studies  ordered:  I ordered imaging studies including CT head, CT cervical spine I independently visualized and interpreted imaging which showed no acute intracranial abnormality or any fractures dislocations of the neck, prior cervical fusions at C5-C7 noted I agree with the radiologist interpretation   Problem List / ED Course:  Patient presents emergency department complaints of a fall and loss of consciousness.  She reports that she was walking towards her bathroom when she had a brief episode of weakness and believes a syncopal episode which resulted in her hitting her head against the toilet.  She denies any lacerations or bleeding from any area.  Not currently on blood thinners.  States that she previously has had episodes when she gets up out of bed quickly that she feels weak and lightheaded and has to use the wall to ambulate without falling.  Denies any hearing loss, significant headache, confusion, nausea, vomiting.  Advised patient that she would benefit from outpatient follow-up with primary care provider given that there is unclear cause of her symptoms at this time.  Patient is agreeable with this treatment plan verbalized understanding all return precautions.  Encourage patient return to the emergency department she has a recurrence of her symptoms if she would likely need further evaluation given that her current workup was clear without any acute abnormalities noted.  Also provided patient with outpatient referral to cardiology for further evaluation of symptoms.   Final Clinical Impression(s) / ED Diagnoses Final diagnoses:  Syncope, unspecified syncope type  Orthostatic hypotension    Rx / DC Orders ED Discharge Orders          Ordered    Ambulatory referral to Cardiology       Comments: If you have not heard from the Cardiology office within the next 72 hours please call [phone].   06/21/22 2159              Smitty Knudsen, PA-C 06/24/22 2031    Tegeler,  Canary Brim, MD 06/27/22 [id number]

## 2022-06-21 NOTE — ED Notes (Signed)
Pt unable to do the orthostatics at 3 min due to feeling weak. Pt placed back in the bed with the rails up.

## 2022-06-21 NOTE — ED Triage Notes (Signed)
Reported passing out and gained consciousness upon hitting the front right side of her head against the toilet, denies blood thinners.

## 2022-06-21 NOTE — ED Notes (Signed)
Assisted pt to bsc.

## 2022-06-21 NOTE — Discharge Instructions (Addendum)
You were seen in the emergency department for a syncopal episode.  Based on your lab workup, I am not sure exactly what caused your syncopal episode but it does appear that you are having episodes of hypotension when you stand after laying down.  Will be beneficial for you to follow-up with her primary care provider for further evaluation and also for cardiology to evaluate you to ensure that this is not a cardiac problem in nature all your EKG was normal.  Please return to the emergency department if symptoms are worsening, or persistent.

## 2022-06-23 ENCOUNTER — Other Ambulatory Visit (HOSPITAL_BASED_OUTPATIENT_CLINIC_OR_DEPARTMENT_OTHER): Payer: Self-pay

## 2022-06-24 ENCOUNTER — Other Ambulatory Visit (HOSPITAL_BASED_OUTPATIENT_CLINIC_OR_DEPARTMENT_OTHER): Payer: Self-pay

## 2022-06-24 MED ORDER — DAPSONE 25 MG PO TABS
50.0000 mg | ORAL_TABLET | Freq: Every day | ORAL | 0 refills | Status: DC
  Filled 2022-06-24: qty 60, 30d supply, fill #0

## 2022-06-27 ENCOUNTER — Other Ambulatory Visit (HOSPITAL_BASED_OUTPATIENT_CLINIC_OR_DEPARTMENT_OTHER): Payer: Self-pay

## 2022-07-04 ENCOUNTER — Ambulatory Visit: Payer: Commercial Managed Care - PPO | Attending: Internal Medicine | Admitting: Internal Medicine

## 2022-07-04 ENCOUNTER — Encounter: Payer: Self-pay | Admitting: Internal Medicine

## 2022-07-04 ENCOUNTER — Ambulatory Visit (INDEPENDENT_AMBULATORY_CARE_PROVIDER_SITE_OTHER): Payer: Commercial Managed Care - PPO

## 2022-07-04 VITALS — BP 116/71 | HR 73 | Ht 70.0 in | Wt 184.4 lb

## 2022-07-04 DIAGNOSIS — R55 Syncope and collapse: Secondary | ICD-10-CM | POA: Diagnosis not present

## 2022-07-04 NOTE — Progress Notes (Unsigned)
Enrolled for [employer] to mail a ZIO XT long term holter monitor to the patients address on file.  

## 2022-07-04 NOTE — Patient Instructions (Signed)
Medication Instructions:  Your physician recommends that you continue on your current medications as directed. Please refer to the Current Medication list given to you today.  *If you need a refill on your cardiac medications before your next appointment, please call your pharmacy*  Testing/Procedures: Elaine Monitor Instructions   Your physician has requested you wear a ZIO patch monitor for _3_ days.  This is a single patch monitor.   [employer] supplies one patch monitor per enrollment. Additional stickers are not available. Please do not apply patch if you will be having a Nuclear Stress Test, Echocardiogram, Cardiac CT, MRI, or Chest Xray during the period you would be wearing the monitor. The patch cannot be worn during these tests. You cannot remove and re-apply the ZIO XT patch monitor.  Your ZIO patch monitor will be sent Fed Ex from [employer] directly to your home address. It may take 3-5 days to receive your monitor after you have been enrolled.  Once you have received your monitor, please review the enclosed instructions. Your monitor has already been registered assigning a specific monitor serial # to you.  Billing and Patient Assistance Program Information   We have supplied [employer] with any of your insurance information on file for billing purposes. [employer] offers a sliding scale Patient Assistance Program for patients that do not have insurance, or whose insurance does not completely cover the cost of the ZIO monitor.   You must apply for the Patient Assistance Program to qualify for this discounted rate.     To apply, please call [employer] at [phone], select option 4, then select option 2, and ask to apply for Patient Assistance Program.  Theodore Demark will ask your household income, and how many people are in your household.  They will quote your out-of-pocket cost based on that information.  [employer] will also be able to set up a 55-month interest-free payment plan  if needed.  Applying the monitor   Shave hair from upper left chest.  Hold abrader disc by orange tab. Rub abrader in 40 strokes over the upper left chest as indicated in your monitor instructions.  Clean area with 4 enclosed alcohol pads. Let dry.  Apply patch as indicated in monitor instructions. Patch will be placed under collarbone on left side of chest with arrow pointing upward.  Rub patch adhesive wings for 2 minutes. Remove white label marked "1". Remove the white label marked "2". Rub patch adhesive wings for 2 additional minutes.  While looking in a mirror, press and release button in center of patch. A small green light will flash 3-4 times. This will be your only indicator that the monitor has been turned on. ?  Do not shower for the first 24 hours. You may shower after the first 24 hours.  Press the button if you feel a symptom. You will hear a small click. Record Date, Time and Symptom in the Patient Logbook.  When you are ready to remove the patch, follow instructions on the last 2 pages of the Patient Logbook. Stick patch monitor onto the last page of Patient Logbook.  Place Patient Logbook in the blue and white box.  Use locking tab on box and tape box closed securely.  The blue and white box has prepaid postage on it. Please place it in the mailbox as soon as possible. Your physician should have your test results approximately 7 days after [facility]  Call [facility]  at [phone] if you have questions regarding your ZIO XT patch monitor. Call them immediately if you see an orange light blinking on your monitor.  If your monitor falls off in less than 4 days, contact our Monitor department at [phone]. ?If your monitor becomes loose or falls off after 4 days call [employer] at [phone] for suggestions on securing your monitor.?  Follow-Up: At [facility], you and your health needs are our priority.  As  part of our continuing mission to provide you with exceptional heart care, we have created designated Provider Care Teams.  These Care Teams include your primary Cardiologist (physician) and Advanced Practice Providers (APPs -  Physician Assistants and Nurse Practitioners) who all work together to provide you with the care you need, when you need it.  We recommend signing up for the patient portal called "MyChart".  Sign up information is provided on this After Visit Summary.  MyChart is used to connect with patients for Virtual Visits (Telemedicine).  Patients are able to view lab/test results, encounter notes, upcoming appointments, etc.  Non-urgent messages can be sent to your provider as well.   To learn more about what you can do with MyChart, go to [email or web address].    Your next appointment:   AS NEEDED with Dr. Wyline Mood

## 2022-07-04 NOTE — Progress Notes (Signed)
Cardiology Office Note:    Date:  07/04/2022   ID:  Becky Simmons, DOB 04-02-1970, MRN [id number]  PCP:  Laqueta Due., MD   [facility] Providers Cardiologist:  Maisie Fus, MD     Referring MD: Laqueta Due., MD   No chief complaint on file. LOC  History of Present Illness:    Becky Simmons is a 52 y.o. female with a hx of HTN, DM2, referral from the ED for LOC. Per ED notes: "She reports that she was walking towards her toilet earlier tonight when she had a brief episode of loss of consciousness. She states she was only unconscious for a few seconds but does report that she hit the right side of her head against the toilet." She noted LH with standing. C/f orthostatic syncope but don't see them done. Her EKG was normal. She has no hx of cardiac disease. She was referred to cardiology.  Today, no recurrent issues. Not happened before. She felt unsteady, she thought she may pass out.  Feels occasional palpitations. She is on mounjaro. He has lost weight. She was recommended to stop taking BP meds  Blood pressure looks good today  Orthostatics today are negative.  No family hx of premature CAD or SCD.   Past Medical History:  Diagnosis Date   Bone spur    cervical   DDD (degenerative disc disease), cervical    Hypertension    Migraine    Nerve compression    Type II or unspecified type diabetes mellitus without mention of complication, not stated as uncontrolled     Past Surgical History:  Procedure Laterality Date   BUNIONECTOMY Bilateral    CESAREAN SECTION  2012   MYOMECTOMY     TUBAL LIGATION     TUBAL LIGATION  02/2010    Current Medications: Current Meds  Medication Sig   Assure Comfort Lancets 28G MISC USE AS DIRECTED TWICE DAILY   atorvastatin (LIPITOR) 40 MG tablet TAKE 1 TABLET (40 MG TOTAL) BY MOUTH DAILY.   atorvastatin (LIPITOR) 40 MG tablet Take 1 tablet by mouth daily.   Blood Glucose Monitoring Suppl (FIFTY50 GLUCOSE METER 2.0)  w/Device KIT Use as instructed to check blood sugar   dapsone 25 MG tablet Take 2 tablets (50 mg total) by mouth daily.   ergocalciferol (VITAMIN D2) 1.25 MG (50000 UT) capsule Take 1 capsule by mouth every 7 days   glucose blood (FREESTYLE LITE) test strip Use for blood sugar monitoring twice daily.   Lancets (FREESTYLE) lancets Use to check blood sugar twice daily   lisinopril (ZESTRIL) 20 MG tablet TAKE 1 TABLET (20 MG TOTAL) BY MOUTH DAILY.   Transport planner (BD SHARPS COLLECTOR) MISC Use as needed   spironolactone (ALDACTONE) 50 MG tablet Take 1 tablet (50 mg total) by mouth daily after a meal.   timolol (BETIMOL) 0.5 % ophthalmic solution Place 1 drop into the right eye daily.   tirzepatide (MOUNJARO) 15 MG/0.5ML Pen Inject 15 mg into the skin once a week.   [DISCONTINUED] Adalimumab (HUMIRA, 2 PEN,) 40 MG/0.8ML PNKT Inject 0.8 mL (40 mg total) under the skin every 7 days.     Allergies:   Invokana [canagliflozin] and Metformin and related   Social History   Socioeconomic History   Marital status: Married    Spouse name: Not on file   Number of children: Not on file   Years of education: Not on file   Highest education level: Not  on file  Occupational History   Occupation: Homemaker  Tobacco Use   Smoking status: Never   Smokeless tobacco: Never  Substance and Sexual Activity   Alcohol use: Yes    Comment: Occasional   Drug use: No   Sexual activity: Not on file  Other Topics Concern   Not on file  Social History Narrative   Marital Status:  Married Becky Simmons)    Children:  5   Pets: Dog (1)   Living Situation: Lives with spouse and children.    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  She will graduate in May.     Tobacco Use/Exposure:  None    Alcohol Use:  Occasional   Drug Use:  None   Diet:  Regular   Exercise:  None   Hobbies: Movies                Social  Determinants of Health   Financial Resource Strain: Not on file  Food Insecurity: Not on file  Transportation Needs: Not on file  Physical Activity: Not on file  Stress: Not on file  Social Connections: Not on file     Family History: The patient's family history includes Cancer in her father; Diabetes in her maternal grandmother, mother, and paternal grandmother; Hypertension in her father.  ROS:   Please see the history of present illness.     All other systems reviewed and are negative.  EKGs/Labs/Other Studies Reviewed:    The following studies were reviewed today:   EKG:  EKG is  ordered today.  The ekg ordered today demonstrates   Recent Labs: 06/21/2022: BUN 15; Creatinine, Ser 0.89; Hemoglobin 12.4; Platelets 193; Potassium 3.5; Sodium 136   Recent Lipid Panel No results found for: "CHOL", "TRIG", "HDL", "CHOLHDL", "VLDL", "LDLCALC", "LDLDIRECT"   Risk Assessment/Calculations:     Physical Exam:    VS:  Vitals:   07/04/22 0825  BP: 116/71  Pulse: 73  SpO2: 99%      BP 116/71   Pulse 73   Ht 5\' 10"  (1.778 m)   Wt 184 lb 6.4 oz (83.6 kg)   LMP 06/05/2022   SpO2 99%   BMI 26.46 kg/m     Wt Readings from Last 3 Encounters:  07/04/22 184 lb 6.4 oz (83.6 kg)  06/21/22 179 lb (81.2 kg)  02/18/18 221 lb 3.2 oz (100.3 kg)     GEN:  Well nourished, well developed in no acute distress HEENT: Normal NECK: No JVD; No carotid bruits CARDIAC: RRR, no murmurs, rubs, gallops RESPIRATORY:  Clear to auscultation without rales, wheezing or rhonchi  ABDOMEN: Soft, non-tender, non-distended MUSCULOSKELETAL:  No edema; No deformity  SKIN: Warm and dry NEUROLOGIC:  Alert and oriented x 3 PSYCHIATRIC:  Normal affect   ASSESSMENT:   Syncope: no signs of HOCM. Can monitor for a few days. Suspect vasovagal/dehydration. Recommended she hydrate well. PLAN:    In order of problems listed above:  3 day ziopatch Follow up PRN      Medication Adjustments/Labs  and Tests Ordered: Current medicines are reviewed at length with the patient today.  Concerns regarding medicines are outlined above.  Orders Placed This Encounter  Procedures   LONG TERM MONITOR (3-14 DAYS)   No orders of the defined types were placed in this encounter.   Patient Instructions  Medication Instructions:  Your physician recommends that you continue on your current medications as directed.  Please refer to the Current Medication list given to you today.  *If you need a refill on your cardiac medications before your next appointment, please call your pharmacy*  Testing/Procedures: ZIO XT- Long Term Monitor Instructions   Your physician has requested you wear a ZIO patch monitor for _3_ days.  This is a single patch monitor.   [employer] supplies one patch monitor per enrollment. Additional stickers are not available. Please do not apply patch if you will be having a Nuclear Stress Test, Echocardiogram, Cardiac CT, MRI, or Chest Xray during the period you would be wearing the monitor. The patch cannot be worn during these tests. You cannot remove and re-apply the ZIO XT patch monitor.  Your ZIO patch monitor will be sent Fed Ex from [employer] directly to your home address. It may take 3-5 days to receive your monitor after you have been enrolled.  Once you have received your monitor, please review the enclosed instructions. Your monitor has already been registered assigning a specific monitor serial # to you.  Billing and Patient Assistance Program Information   We have supplied [employer] with any of your insurance information on file for billing purposes. [employer] offers a sliding scale Patient Assistance Program for patients that do not have insurance, or whose insurance does not completely cover the cost of the ZIO monitor.   You must apply for the Patient Assistance Program to qualify for this discounted rate.     To apply, please call [employer] at [phone], select  option 4, then select option 2, and ask to apply for Patient Assistance Program.  Becky Simmons will ask your household income, and how many people are in your household.  They will quote your out-of-pocket cost based on that information.  [employer] will also be able to set up a 20-month, interest-free payment plan if needed.  Applying the monitor   Shave hair from upper left chest.  Hold abrader disc by orange tab. Rub abrader in 40 strokes over the upper left chest as indicated in your monitor instructions.  Clean area with 4 enclosed alcohol pads. Let dry.  Apply patch as indicated in monitor instructions. Patch will be placed under collarbone on left side of chest with arrow pointing upward.  Rub patch adhesive wings for 2 minutes. Remove white label marked "1". Remove the white label marked "2". Rub patch adhesive wings for 2 additional minutes.  While looking in a mirror, press and release button in center of patch. A small green light will flash 3-4 times. This will be your only indicator that the monitor has been turned on. ?  Do not shower for the first 24 hours. You may shower after the first 24 hours.  Press the button if you feel a symptom. You will hear a small click. Record Date, Time and Symptom in the Patient Logbook.  When you are ready to remove the patch, follow instructions on the last 2 pages of the Patient Logbook. Stick patch monitor onto the last page of Patient Logbook.  Place Patient Logbook in the blue and white box.  Use locking tab on box and tape box closed securely.  The blue and white box has prepaid postage on it. Please place it in the mailbox as soon as possible. Your physician should have your test results approximately 7 days after the monitor has been mailed back to [facility].  Call [facility] Customer Care at [phone] if you have questions regarding your ZIO XT patch monitor. Call them immediately  if you see an orange light blinking on your monitor.  If  your monitor falls off in less than 4 days, contact our Monitor department at [phone]. ?If your monitor becomes loose or falls off after 4 days call [employer] at [phone] for suggestions on securing your monitor.?  Follow-Up: At [facility], you and your health needs are our priority.  As part of our continuing mission to provide you with exceptional heart care, we have created designated Provider Care Teams.  These Care Teams include your primary Cardiologist (physician) and Advanced Practice Providers (APPs -  Physician Assistants and Nurse Practitioners) who all work together to provide you with the care you need, when you need it.  We recommend signing up for the patient portal called "MyChart".  Sign up information is provided on this After Visit Summary.  MyChart is used to connect with patients for Virtual Visits (Telemedicine).  Patients are able to view lab/test results, encounter notes, upcoming appointments, etc.  Non-urgent messages can be sent to your provider as well.   To learn more about what you can do with MyChart, go to [email or web address].    Your next appointment:   AS NEEDED with Dr. Wyline Mood     Signed, Maisie Fus, MD  07/04/2022 10:35 AM    [facility]

## 2022-07-06 DIAGNOSIS — R55 Syncope and collapse: Secondary | ICD-10-CM | POA: Diagnosis not present

## 2022-07-14 DIAGNOSIS — R55 Syncope and collapse: Secondary | ICD-10-CM | POA: Diagnosis not present

## 2022-07-28 ENCOUNTER — Other Ambulatory Visit (HOSPITAL_BASED_OUTPATIENT_CLINIC_OR_DEPARTMENT_OTHER): Payer: Self-pay

## 2022-07-29 ENCOUNTER — Other Ambulatory Visit (HOSPITAL_BASED_OUTPATIENT_CLINIC_OR_DEPARTMENT_OTHER): Payer: Self-pay

## 2022-08-01 ENCOUNTER — Other Ambulatory Visit (HOSPITAL_BASED_OUTPATIENT_CLINIC_OR_DEPARTMENT_OTHER): Payer: Self-pay

## 2022-08-01 MED ORDER — MOUNJARO 12.5 MG/0.5ML ~~LOC~~ SOAJ
12.5000 mg | SUBCUTANEOUS | 0 refills | Status: AC
  Filled 2022-08-01: qty 2, 28d supply, fill #0
  Filled 2022-08-26: qty 2, 28d supply, fill #1
  Filled 2022-09-29: qty 2, 28d supply, fill #2

## 2022-08-12 ENCOUNTER — Other Ambulatory Visit (HOSPITAL_BASED_OUTPATIENT_CLINIC_OR_DEPARTMENT_OTHER): Payer: Self-pay

## 2022-08-15 ENCOUNTER — Other Ambulatory Visit (HOSPITAL_BASED_OUTPATIENT_CLINIC_OR_DEPARTMENT_OTHER): Payer: Self-pay

## 2022-08-15 MED ORDER — DAPSONE 25 MG PO TABS
50.0000 mg | ORAL_TABLET | Freq: Every day | ORAL | 0 refills | Status: DC
  Filled 2022-08-15: qty 60, 30d supply, fill #0

## 2022-08-16 ENCOUNTER — Other Ambulatory Visit (HOSPITAL_BASED_OUTPATIENT_CLINIC_OR_DEPARTMENT_OTHER): Payer: Self-pay

## 2022-08-17 ENCOUNTER — Other Ambulatory Visit (HOSPITAL_BASED_OUTPATIENT_CLINIC_OR_DEPARTMENT_OTHER): Payer: Self-pay

## 2022-08-17 DIAGNOSIS — Z7985 Long-term (current) use of injectable non-insulin antidiabetic drugs: Secondary | ICD-10-CM | POA: Diagnosis not present

## 2022-08-17 DIAGNOSIS — E1169 Type 2 diabetes mellitus with other specified complication: Secondary | ICD-10-CM | POA: Diagnosis not present

## 2022-08-17 DIAGNOSIS — R252 Cramp and spasm: Secondary | ICD-10-CM | POA: Diagnosis not present

## 2022-08-17 DIAGNOSIS — E785 Hyperlipidemia, unspecified: Secondary | ICD-10-CM | POA: Diagnosis not present

## 2022-08-17 DIAGNOSIS — I10 Essential (primary) hypertension: Secondary | ICD-10-CM | POA: Diagnosis not present

## 2022-08-17 DIAGNOSIS — E119 Type 2 diabetes mellitus without complications: Secondary | ICD-10-CM | POA: Diagnosis not present

## 2022-08-26 ENCOUNTER — Other Ambulatory Visit (HOSPITAL_BASED_OUTPATIENT_CLINIC_OR_DEPARTMENT_OTHER): Payer: Self-pay

## 2022-08-26 MED ORDER — TIMOLOL MALEATE 0.5 % OP SOLN
OPHTHALMIC | 11 refills | Status: AC
  Filled 2022-08-26: qty 5, 90d supply, fill #0
  Filled 2023-04-20: qty 5, 90d supply, fill #1

## 2022-08-29 ENCOUNTER — Other Ambulatory Visit (HOSPITAL_BASED_OUTPATIENT_CLINIC_OR_DEPARTMENT_OTHER): Payer: Self-pay

## 2022-09-29 ENCOUNTER — Other Ambulatory Visit (HOSPITAL_BASED_OUTPATIENT_CLINIC_OR_DEPARTMENT_OTHER): Payer: Self-pay

## 2022-09-29 MED ORDER — VITAMIN D (ERGOCALCIFEROL) 1.25 MG (50000 UNIT) PO CAPS
50000.0000 [IU] | ORAL_CAPSULE | ORAL | 0 refills | Status: DC
  Filled 2022-09-29: qty 4, 28d supply, fill #0

## 2022-10-06 ENCOUNTER — Other Ambulatory Visit: Payer: Self-pay

## 2022-10-07 ENCOUNTER — Other Ambulatory Visit (HOSPITAL_BASED_OUTPATIENT_CLINIC_OR_DEPARTMENT_OTHER): Payer: Self-pay

## 2022-10-07 ENCOUNTER — Other Ambulatory Visit: Payer: Self-pay

## 2022-10-07 MED ORDER — DAPSONE 25 MG PO TABS
50.0000 mg | ORAL_TABLET | Freq: Every day | ORAL | 2 refills | Status: AC
  Filled 2022-10-07: qty 60, 30d supply, fill #0
  Filled 2022-11-10: qty 60, 30d supply, fill #1

## 2022-10-10 ENCOUNTER — Other Ambulatory Visit (HOSPITAL_BASED_OUTPATIENT_CLINIC_OR_DEPARTMENT_OTHER): Payer: Self-pay

## 2022-10-25 ENCOUNTER — Other Ambulatory Visit (HOSPITAL_BASED_OUTPATIENT_CLINIC_OR_DEPARTMENT_OTHER): Payer: Self-pay

## 2022-10-25 MED ORDER — NITROFURANTOIN MONOHYD MACRO 100 MG PO CAPS
100.0000 mg | ORAL_CAPSULE | Freq: Two times a day (BID) | ORAL | 0 refills | Status: AC
  Filled 2022-10-25: qty 14, 7d supply, fill #0

## 2022-11-10 ENCOUNTER — Other Ambulatory Visit (HOSPITAL_BASED_OUTPATIENT_CLINIC_OR_DEPARTMENT_OTHER): Payer: Self-pay

## 2022-11-12 ENCOUNTER — Encounter (HOSPITAL_COMMUNITY): Payer: Self-pay

## 2022-11-28 ENCOUNTER — Other Ambulatory Visit (HOSPITAL_BASED_OUTPATIENT_CLINIC_OR_DEPARTMENT_OTHER): Payer: Self-pay

## 2022-11-29 ENCOUNTER — Other Ambulatory Visit (HOSPITAL_BASED_OUTPATIENT_CLINIC_OR_DEPARTMENT_OTHER): Payer: Self-pay

## 2022-11-29 DIAGNOSIS — Z5181 Encounter for therapeutic drug level monitoring: Secondary | ICD-10-CM | POA: Diagnosis not present

## 2022-11-29 DIAGNOSIS — L732 Hidradenitis suppurativa: Secondary | ICD-10-CM | POA: Diagnosis not present

## 2022-11-29 MED ORDER — DAPSONE 25 MG PO TABS
75.0000 mg | ORAL_TABLET | Freq: Every day | ORAL | 6 refills | Status: AC
  Filled 2022-11-29 – 2022-12-05 (×2): qty 90, 30d supply, fill #0
  Filled 2023-01-20: qty 90, 30d supply, fill #1
  Filled 2023-02-28: qty 90, 30d supply, fill #2
  Filled 2023-03-27: qty 90, 30d supply, fill #3
  Filled 2023-05-19: qty 90, 30d supply, fill #4
  Filled 2023-06-07 – 2023-06-12 (×3): qty 90, 30d supply, fill #5

## 2022-11-29 MED ORDER — SPIRONOLACTONE 50 MG PO TABS
50.0000 mg | ORAL_TABLET | Freq: Every day | ORAL | 6 refills | Status: AC
  Filled 2022-11-29: qty 30, 30d supply, fill #0
  Filled 2022-12-05: qty 90, 90d supply, fill #0
  Filled 2023-06-07: qty 90, 90d supply, fill #1

## 2022-12-05 ENCOUNTER — Other Ambulatory Visit (HOSPITAL_BASED_OUTPATIENT_CLINIC_OR_DEPARTMENT_OTHER): Payer: Self-pay

## 2022-12-05 ENCOUNTER — Other Ambulatory Visit: Payer: Self-pay

## 2022-12-08 ENCOUNTER — Other Ambulatory Visit (HOSPITAL_BASED_OUTPATIENT_CLINIC_OR_DEPARTMENT_OTHER): Payer: Self-pay

## 2022-12-08 MED ORDER — INFLUENZA VIRUS VACC SPLIT PF (FLUZONE) 0.5 ML IM SUSY
0.5000 mL | PREFILLED_SYRINGE | Freq: Once | INTRAMUSCULAR | 0 refills | Status: AC
  Filled 2022-12-08: qty 0.5, 1d supply, fill #0

## 2022-12-26 ENCOUNTER — Other Ambulatory Visit (HOSPITAL_BASED_OUTPATIENT_CLINIC_OR_DEPARTMENT_OTHER): Payer: Self-pay

## 2022-12-28 DIAGNOSIS — N814 Uterovaginal prolapse, unspecified: Secondary | ICD-10-CM | POA: Diagnosis not present

## 2022-12-28 DIAGNOSIS — I10 Essential (primary) hypertension: Secondary | ICD-10-CM | POA: Diagnosis not present

## 2022-12-28 DIAGNOSIS — E119 Type 2 diabetes mellitus without complications: Secondary | ICD-10-CM | POA: Diagnosis not present

## 2022-12-28 DIAGNOSIS — Z01419 Encounter for gynecological examination (general) (routine) without abnormal findings: Secondary | ICD-10-CM | POA: Diagnosis not present

## 2022-12-28 DIAGNOSIS — H409 Unspecified glaucoma: Secondary | ICD-10-CM | POA: Diagnosis not present

## 2022-12-28 DIAGNOSIS — E7800 Pure hypercholesterolemia, unspecified: Secondary | ICD-10-CM | POA: Diagnosis not present

## 2022-12-29 ENCOUNTER — Other Ambulatory Visit: Payer: Self-pay

## 2022-12-30 ENCOUNTER — Other Ambulatory Visit (HOSPITAL_BASED_OUTPATIENT_CLINIC_OR_DEPARTMENT_OTHER): Payer: Self-pay

## 2022-12-30 MED ORDER — MOUNJARO 12.5 MG/0.5ML ~~LOC~~ SOAJ
12.5000 mg | SUBCUTANEOUS | 1 refills | Status: AC
  Filled 2022-12-30: qty 2, 28d supply, fill #0
  Filled 2023-01-20 – 2023-01-24 (×2): qty 2, 28d supply, fill #1
  Filled 2023-06-07 – 2023-06-12 (×2): qty 2, 28d supply, fill #2
  Filled 2023-07-25: qty 2, 28d supply, fill #3

## 2023-01-20 ENCOUNTER — Other Ambulatory Visit (HOSPITAL_BASED_OUTPATIENT_CLINIC_OR_DEPARTMENT_OTHER): Payer: Self-pay

## 2023-01-20 ENCOUNTER — Other Ambulatory Visit: Payer: Self-pay

## 2023-01-22 ENCOUNTER — Other Ambulatory Visit (HOSPITAL_BASED_OUTPATIENT_CLINIC_OR_DEPARTMENT_OTHER): Payer: Self-pay

## 2023-01-23 ENCOUNTER — Other Ambulatory Visit: Payer: Self-pay

## 2023-01-24 ENCOUNTER — Other Ambulatory Visit (HOSPITAL_BASED_OUTPATIENT_CLINIC_OR_DEPARTMENT_OTHER): Payer: Self-pay

## 2023-02-01 ENCOUNTER — Other Ambulatory Visit (HOSPITAL_BASED_OUTPATIENT_CLINIC_OR_DEPARTMENT_OTHER): Payer: Self-pay

## 2023-02-06 DIAGNOSIS — R102 Pelvic and perineal pain: Secondary | ICD-10-CM | POA: Diagnosis not present

## 2023-02-17 DIAGNOSIS — N6032 Fibrosclerosis of left breast: Secondary | ICD-10-CM | POA: Diagnosis not present

## 2023-02-17 DIAGNOSIS — N6322 Unspecified lump in the left breast, upper inner quadrant: Secondary | ICD-10-CM | POA: Diagnosis not present

## 2023-02-17 DIAGNOSIS — R92321 Mammographic fibroglandular density, right breast: Secondary | ICD-10-CM | POA: Diagnosis not present

## 2023-02-17 DIAGNOSIS — N6312 Unspecified lump in the right breast, upper inner quadrant: Secondary | ICD-10-CM | POA: Diagnosis not present

## 2023-02-17 DIAGNOSIS — R92323 Mammographic fibroglandular density, bilateral breasts: Secondary | ICD-10-CM | POA: Diagnosis not present

## 2023-02-17 DIAGNOSIS — R928 Other abnormal and inconclusive findings on diagnostic imaging of breast: Secondary | ICD-10-CM | POA: Diagnosis not present

## 2023-02-24 ENCOUNTER — Other Ambulatory Visit (HOSPITAL_BASED_OUTPATIENT_CLINIC_OR_DEPARTMENT_OTHER): Payer: Self-pay

## 2023-02-24 DIAGNOSIS — E119 Type 2 diabetes mellitus without complications: Secondary | ICD-10-CM | POA: Diagnosis not present

## 2023-02-24 DIAGNOSIS — E785 Hyperlipidemia, unspecified: Secondary | ICD-10-CM | POA: Diagnosis not present

## 2023-02-24 DIAGNOSIS — R252 Cramp and spasm: Secondary | ICD-10-CM | POA: Diagnosis not present

## 2023-02-24 DIAGNOSIS — G629 Polyneuropathy, unspecified: Secondary | ICD-10-CM | POA: Diagnosis not present

## 2023-02-24 DIAGNOSIS — E559 Vitamin D deficiency, unspecified: Secondary | ICD-10-CM | POA: Diagnosis not present

## 2023-02-24 DIAGNOSIS — E1169 Type 2 diabetes mellitus with other specified complication: Secondary | ICD-10-CM | POA: Diagnosis not present

## 2023-02-24 MED ORDER — MOUNJARO 12.5 MG/0.5ML ~~LOC~~ SOAJ
12.5000 mg | SUBCUTANEOUS | 1 refills | Status: DC
  Filled 2023-02-24: qty 2, 28d supply, fill #0
  Filled 2023-03-27: qty 2, 28d supply, fill #1
  Filled 2023-04-20: qty 2, 28d supply, fill #2
  Filled 2023-05-19: qty 2, 28d supply, fill #3
  Filled 2023-09-04: qty 2, 28d supply, fill #4

## 2023-02-28 ENCOUNTER — Other Ambulatory Visit: Payer: Self-pay

## 2023-02-28 ENCOUNTER — Other Ambulatory Visit (HOSPITAL_BASED_OUTPATIENT_CLINIC_OR_DEPARTMENT_OTHER): Payer: Self-pay

## 2023-03-27 ENCOUNTER — Other Ambulatory Visit: Payer: Self-pay

## 2023-03-27 ENCOUNTER — Other Ambulatory Visit (HOSPITAL_BASED_OUTPATIENT_CLINIC_OR_DEPARTMENT_OTHER): Payer: Self-pay

## 2023-03-27 MED ORDER — ATORVASTATIN CALCIUM 40 MG PO TABS
40.0000 mg | ORAL_TABLET | Freq: Every day | ORAL | 0 refills | Status: DC
  Filled 2023-03-27: qty 30, 30d supply, fill #0

## 2023-04-20 ENCOUNTER — Other Ambulatory Visit: Payer: Self-pay

## 2023-04-20 ENCOUNTER — Other Ambulatory Visit (HOSPITAL_BASED_OUTPATIENT_CLINIC_OR_DEPARTMENT_OTHER): Payer: Self-pay

## 2023-04-24 ENCOUNTER — Other Ambulatory Visit (HOSPITAL_BASED_OUTPATIENT_CLINIC_OR_DEPARTMENT_OTHER): Payer: Self-pay

## 2023-04-24 MED ORDER — ATORVASTATIN CALCIUM 40 MG PO TABS
40.0000 mg | ORAL_TABLET | Freq: Every day | ORAL | 0 refills | Status: DC
  Filled 2023-04-24: qty 30, 30d supply, fill #0

## 2023-04-28 DIAGNOSIS — M79605 Pain in left leg: Secondary | ICD-10-CM | POA: Diagnosis not present

## 2023-04-28 DIAGNOSIS — R2242 Localized swelling, mass and lump, left lower limb: Secondary | ICD-10-CM | POA: Diagnosis not present

## 2023-04-28 DIAGNOSIS — R600 Localized edema: Secondary | ICD-10-CM | POA: Diagnosis not present

## 2023-04-28 DIAGNOSIS — M7989 Other specified soft tissue disorders: Secondary | ICD-10-CM | POA: Diagnosis not present

## 2023-05-19 ENCOUNTER — Other Ambulatory Visit (HOSPITAL_BASED_OUTPATIENT_CLINIC_OR_DEPARTMENT_OTHER): Payer: Self-pay

## 2023-05-30 DIAGNOSIS — Z1211 Encounter for screening for malignant neoplasm of colon: Secondary | ICD-10-CM | POA: Diagnosis not present

## 2023-05-30 DIAGNOSIS — E1165 Type 2 diabetes mellitus with hyperglycemia: Secondary | ICD-10-CM | POA: Diagnosis not present

## 2023-05-30 DIAGNOSIS — E782 Mixed hyperlipidemia: Secondary | ICD-10-CM | POA: Diagnosis not present

## 2023-05-30 DIAGNOSIS — Z0000 Encounter for general adult medical examination without abnormal findings: Secondary | ICD-10-CM | POA: Diagnosis not present

## 2023-05-30 DIAGNOSIS — Z2821 Immunization not carried out because of patient refusal: Secondary | ICD-10-CM | POA: Diagnosis not present

## 2023-05-30 DIAGNOSIS — I10 Essential (primary) hypertension: Secondary | ICD-10-CM | POA: Diagnosis not present

## 2023-05-30 DIAGNOSIS — Z124 Encounter for screening for malignant neoplasm of cervix: Secondary | ICD-10-CM | POA: Diagnosis not present

## 2023-05-30 DIAGNOSIS — Z1231 Encounter for screening mammogram for malignant neoplasm of breast: Secondary | ICD-10-CM | POA: Diagnosis not present

## 2023-05-30 DIAGNOSIS — D500 Iron deficiency anemia secondary to blood loss (chronic): Secondary | ICD-10-CM | POA: Diagnosis not present

## 2023-05-30 DIAGNOSIS — R252 Cramp and spasm: Secondary | ICD-10-CM | POA: Diagnosis not present

## 2023-06-07 ENCOUNTER — Other Ambulatory Visit (HOSPITAL_BASED_OUTPATIENT_CLINIC_OR_DEPARTMENT_OTHER): Payer: Self-pay

## 2023-06-07 ENCOUNTER — Other Ambulatory Visit: Payer: Self-pay

## 2023-06-07 MED ORDER — ATORVASTATIN CALCIUM 40 MG PO TABS
40.0000 mg | ORAL_TABLET | Freq: Every day | ORAL | 3 refills | Status: DC
  Filled 2023-06-07: qty 30, 30d supply, fill #0

## 2023-06-19 ENCOUNTER — Other Ambulatory Visit (HOSPITAL_BASED_OUTPATIENT_CLINIC_OR_DEPARTMENT_OTHER): Payer: Self-pay

## 2023-07-03 DIAGNOSIS — R17 Unspecified jaundice: Secondary | ICD-10-CM | POA: Diagnosis not present

## 2023-07-25 ENCOUNTER — Other Ambulatory Visit (HOSPITAL_BASED_OUTPATIENT_CLINIC_OR_DEPARTMENT_OTHER): Payer: Self-pay

## 2023-09-04 ENCOUNTER — Other Ambulatory Visit (HOSPITAL_BASED_OUTPATIENT_CLINIC_OR_DEPARTMENT_OTHER): Payer: Self-pay

## 2023-09-05 ENCOUNTER — Other Ambulatory Visit (HOSPITAL_BASED_OUTPATIENT_CLINIC_OR_DEPARTMENT_OTHER): Payer: Self-pay

## 2023-09-05 DIAGNOSIS — E1169 Type 2 diabetes mellitus with other specified complication: Secondary | ICD-10-CM | POA: Diagnosis not present

## 2023-09-05 DIAGNOSIS — Z7985 Long-term (current) use of injectable non-insulin antidiabetic drugs: Secondary | ICD-10-CM | POA: Diagnosis not present

## 2023-09-05 DIAGNOSIS — I10 Essential (primary) hypertension: Secondary | ICD-10-CM | POA: Diagnosis not present

## 2023-09-05 DIAGNOSIS — E785 Hyperlipidemia, unspecified: Secondary | ICD-10-CM | POA: Diagnosis not present

## 2023-09-05 MED ORDER — MOUNJARO 10 MG/0.5ML ~~LOC~~ SOAJ
10.0000 mg | SUBCUTANEOUS | 1 refills | Status: DC
  Filled 2023-09-05: qty 6, 84d supply, fill #0
  Filled 2023-09-27: qty 2, 28d supply, fill #0
  Filled 2023-10-27: qty 2, 28d supply, fill #1
  Filled 2023-11-28: qty 2, 28d supply, fill #2
  Filled 2023-12-25: qty 2, 28d supply, fill #3

## 2023-09-27 ENCOUNTER — Other Ambulatory Visit (HOSPITAL_COMMUNITY): Payer: Self-pay

## 2023-09-27 ENCOUNTER — Other Ambulatory Visit: Payer: Self-pay

## 2023-09-29 ENCOUNTER — Other Ambulatory Visit: Payer: Self-pay

## 2023-10-04 DIAGNOSIS — H401212 Low-tension glaucoma, right eye, moderate stage: Secondary | ICD-10-CM | POA: Diagnosis not present

## 2023-10-04 DIAGNOSIS — E119 Type 2 diabetes mellitus without complications: Secondary | ICD-10-CM | POA: Diagnosis not present

## 2023-10-27 ENCOUNTER — Other Ambulatory Visit (HOSPITAL_COMMUNITY): Payer: Self-pay

## 2023-11-28 ENCOUNTER — Other Ambulatory Visit (HOSPITAL_COMMUNITY): Payer: Self-pay

## 2023-12-04 DIAGNOSIS — E1165 Type 2 diabetes mellitus with hyperglycemia: Secondary | ICD-10-CM | POA: Diagnosis not present

## 2023-12-04 DIAGNOSIS — D500 Iron deficiency anemia secondary to blood loss (chronic): Secondary | ICD-10-CM | POA: Diagnosis not present

## 2023-12-04 DIAGNOSIS — E782 Mixed hyperlipidemia: Secondary | ICD-10-CM | POA: Diagnosis not present

## 2023-12-04 DIAGNOSIS — I10 Essential (primary) hypertension: Secondary | ICD-10-CM | POA: Diagnosis not present

## 2023-12-14 DIAGNOSIS — D696 Thrombocytopenia, unspecified: Secondary | ICD-10-CM | POA: Diagnosis not present

## 2023-12-14 DIAGNOSIS — D709 Neutropenia, unspecified: Secondary | ICD-10-CM | POA: Diagnosis not present

## 2023-12-25 ENCOUNTER — Other Ambulatory Visit (HOSPITAL_COMMUNITY): Payer: Self-pay

## 2024-01-01 DIAGNOSIS — I10 Essential (primary) hypertension: Secondary | ICD-10-CM | POA: Diagnosis not present

## 2024-01-01 DIAGNOSIS — E7800 Pure hypercholesterolemia, unspecified: Secondary | ICD-10-CM | POA: Diagnosis not present

## 2024-01-01 DIAGNOSIS — Z124 Encounter for screening for malignant neoplasm of cervix: Secondary | ICD-10-CM | POA: Diagnosis not present

## 2024-01-01 DIAGNOSIS — Z01419 Encounter for gynecological examination (general) (routine) without abnormal findings: Secondary | ICD-10-CM | POA: Diagnosis not present

## 2024-01-08 ENCOUNTER — Other Ambulatory Visit (HOSPITAL_COMMUNITY): Payer: Self-pay

## 2024-01-08 DIAGNOSIS — E1169 Type 2 diabetes mellitus with other specified complication: Secondary | ICD-10-CM | POA: Diagnosis not present

## 2024-01-08 DIAGNOSIS — E785 Hyperlipidemia, unspecified: Secondary | ICD-10-CM | POA: Diagnosis not present

## 2024-01-08 MED ORDER — MOUNJARO 10 MG/0.5ML ~~LOC~~ SOAJ
10.0000 mg | SUBCUTANEOUS | 1 refills | Status: AC
  Filled 2024-01-08 – 2024-01-17 (×2): qty 6, 84d supply, fill #0

## 2024-01-17 ENCOUNTER — Other Ambulatory Visit (HOSPITAL_COMMUNITY): Payer: Self-pay

## 2024-01-17 ENCOUNTER — Other Ambulatory Visit: Payer: Self-pay

## 2024-01-26 ENCOUNTER — Other Ambulatory Visit: Payer: Self-pay

## 2024-01-26 ENCOUNTER — Other Ambulatory Visit (HOSPITAL_BASED_OUTPATIENT_CLINIC_OR_DEPARTMENT_OTHER): Payer: Self-pay

## 2024-01-26 DIAGNOSIS — L2084 Intrinsic (allergic) eczema: Secondary | ICD-10-CM | POA: Diagnosis not present

## 2024-01-26 MED ORDER — HYDROCORTISONE 2.5 % EX CREA
1.0000 | TOPICAL_CREAM | Freq: Two times a day (BID) | CUTANEOUS | 11 refills | Status: AC
  Filled 2024-01-26: qty 30, 10d supply, fill #0
  Filled 2024-01-26: qty 28, 30d supply, fill #0

## 2024-01-26 MED ORDER — TRIAMCINOLONE ACETONIDE 0.1 % EX CREA
1.0000 | TOPICAL_CREAM | Freq: Two times a day (BID) | CUTANEOUS | 11 refills | Status: AC
  Filled 2024-01-26 (×2): qty 454, 90d supply, fill #0

## 2024-03-14 ENCOUNTER — Other Ambulatory Visit (HOSPITAL_BASED_OUTPATIENT_CLINIC_OR_DEPARTMENT_OTHER): Payer: Self-pay

## 2024-03-14 MED ORDER — TIMOLOL MALEATE 0.5 % OP SOLN
1.0000 [drp] | Freq: Every day | OPHTHALMIC | 2 refills | Status: AC
  Filled 2024-03-14: qty 10, 30d supply, fill #0
  Filled 2024-03-15: qty 5, 90d supply, fill #0

## 2024-03-15 ENCOUNTER — Encounter (HOSPITAL_BASED_OUTPATIENT_CLINIC_OR_DEPARTMENT_OTHER): Payer: Self-pay

## 2024-03-15 ENCOUNTER — Other Ambulatory Visit (HOSPITAL_BASED_OUTPATIENT_CLINIC_OR_DEPARTMENT_OTHER): Payer: Self-pay
